# Patient Record
Sex: Female | Born: 1945 | Race: White | Hispanic: No | Marital: Single | Smoking: Never smoker
Health system: Southern US, Community
[De-identification: ages and names within clinical notes are randomized; demographics above are authoritative.]

## PROBLEM LIST (undated history)

## (undated) DIAGNOSIS — I499 Cardiac arrhythmia, unspecified: Secondary | ICD-10-CM

## (undated) DIAGNOSIS — T8859XA Other complications of anesthesia, initial encounter: Secondary | ICD-10-CM

## (undated) DIAGNOSIS — K469 Unspecified abdominal hernia without obstruction or gangrene: Secondary | ICD-10-CM

## (undated) DIAGNOSIS — F32A Depression, unspecified: Secondary | ICD-10-CM

## (undated) DIAGNOSIS — L659 Nonscarring hair loss, unspecified: Secondary | ICD-10-CM

## (undated) DIAGNOSIS — I471 Supraventricular tachycardia, unspecified: Secondary | ICD-10-CM

## (undated) DIAGNOSIS — F988 Other specified behavioral and emotional disorders with onset usually occurring in childhood and adolescence: Secondary | ICD-10-CM

## (undated) DIAGNOSIS — F329 Major depressive disorder, single episode, unspecified: Secondary | ICD-10-CM

## (undated) DIAGNOSIS — M1990 Unspecified osteoarthritis, unspecified site: Secondary | ICD-10-CM

## (undated) DIAGNOSIS — D649 Anemia, unspecified: Secondary | ICD-10-CM

## (undated) DIAGNOSIS — F909 Attention-deficit hyperactivity disorder, unspecified type: Secondary | ICD-10-CM

## (undated) DIAGNOSIS — K219 Gastro-esophageal reflux disease without esophagitis: Secondary | ICD-10-CM

## (undated) HISTORY — PX: ABDOMINAL HYSTERECTOMY: SHX81

## (undated) HISTORY — DX: Unspecified abdominal hernia without obstruction or gangrene: K46.9

## (undated) HISTORY — PX: BREAST ENHANCEMENT SURGERY: SHX7

## (undated) HISTORY — DX: Unspecified osteoarthritis, unspecified site: M19.90

## (undated) HISTORY — PX: BREAST IMPLANT REMOVAL: SUR1101

## (undated) HISTORY — DX: Attention-deficit hyperactivity disorder, unspecified type: F90.9

## (undated) HISTORY — DX: Depression, unspecified: F32.A

## (undated) HISTORY — DX: Nonscarring hair loss, unspecified: L65.9

---
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
## 1997-08-29 ENCOUNTER — Other Ambulatory Visit: Admission: RE | Admit: 1997-08-29 | Discharge: 1997-08-29 | Payer: Self-pay | Admitting: Obstetrics and Gynecology

## 1998-03-28 ENCOUNTER — Ambulatory Visit (HOSPITAL_COMMUNITY): Admission: RE | Admit: 1998-03-28 | Discharge: 1998-03-28 | Payer: Self-pay | Admitting: Family Medicine

## 1998-03-28 ENCOUNTER — Encounter: Payer: Self-pay | Admitting: Family Medicine

## 1998-05-22 ENCOUNTER — Encounter: Payer: Self-pay | Admitting: Family Medicine

## 1998-05-22 ENCOUNTER — Ambulatory Visit (HOSPITAL_COMMUNITY): Admission: RE | Admit: 1998-05-22 | Discharge: 1998-05-22 | Payer: Self-pay | Admitting: Family Medicine

## 1998-08-05 ENCOUNTER — Encounter: Payer: Self-pay | Admitting: Family Medicine

## 1998-08-05 ENCOUNTER — Ambulatory Visit (HOSPITAL_COMMUNITY): Admission: RE | Admit: 1998-08-05 | Discharge: 1998-08-05 | Payer: Self-pay | Admitting: Family Medicine

## 1998-09-19 ENCOUNTER — Other Ambulatory Visit: Admission: RE | Admit: 1998-09-19 | Discharge: 1998-09-19 | Payer: Self-pay | Admitting: Obstetrics and Gynecology

## 1998-10-06 ENCOUNTER — Encounter: Payer: Self-pay | Admitting: Family Medicine

## 1998-10-06 ENCOUNTER — Ambulatory Visit (HOSPITAL_COMMUNITY): Admission: RE | Admit: 1998-10-06 | Discharge: 1998-10-06 | Payer: Self-pay | Admitting: Family Medicine

## 1998-11-20 ENCOUNTER — Encounter: Payer: Self-pay | Admitting: Family Medicine

## 1998-11-20 ENCOUNTER — Ambulatory Visit (HOSPITAL_COMMUNITY): Admission: RE | Admit: 1998-11-20 | Discharge: 1998-11-20 | Payer: Self-pay | Admitting: Family Medicine

## 1999-01-19 ENCOUNTER — Encounter: Payer: Self-pay | Admitting: Family Medicine

## 1999-01-19 ENCOUNTER — Ambulatory Visit (HOSPITAL_COMMUNITY): Admission: RE | Admit: 1999-01-19 | Discharge: 1999-01-19 | Payer: Self-pay | Admitting: Family Medicine

## 1999-05-15 ENCOUNTER — Encounter: Payer: Self-pay | Admitting: Family Medicine

## 1999-05-15 ENCOUNTER — Ambulatory Visit (HOSPITAL_COMMUNITY): Admission: RE | Admit: 1999-05-15 | Discharge: 1999-05-15 | Payer: Self-pay | Admitting: Family Medicine

## 1999-10-01 ENCOUNTER — Ambulatory Visit (HOSPITAL_COMMUNITY): Admission: RE | Admit: 1999-10-01 | Discharge: 1999-10-01 | Payer: Self-pay | Admitting: Family Medicine

## 1999-10-01 ENCOUNTER — Encounter: Payer: Self-pay | Admitting: Family Medicine

## 1999-11-25 ENCOUNTER — Encounter: Payer: Self-pay | Admitting: Family Medicine

## 1999-11-25 ENCOUNTER — Ambulatory Visit (HOSPITAL_COMMUNITY): Admission: RE | Admit: 1999-11-25 | Discharge: 1999-11-25 | Payer: Self-pay | Admitting: Family Medicine

## 1999-12-07 ENCOUNTER — Other Ambulatory Visit: Admission: RE | Admit: 1999-12-07 | Discharge: 1999-12-07 | Payer: Self-pay | Admitting: Obstetrics and Gynecology

## 2000-01-31 ENCOUNTER — Ambulatory Visit (HOSPITAL_COMMUNITY): Admission: RE | Admit: 2000-01-31 | Discharge: 2000-01-31 | Payer: Self-pay | Admitting: Neurosurgery

## 2000-01-31 ENCOUNTER — Encounter: Payer: Self-pay | Admitting: Neurosurgery

## 2000-12-08 ENCOUNTER — Other Ambulatory Visit: Admission: RE | Admit: 2000-12-08 | Discharge: 2000-12-08 | Payer: Self-pay | Admitting: Obstetrics and Gynecology

## 2001-03-10 ENCOUNTER — Encounter: Payer: Self-pay | Admitting: Family Medicine

## 2001-03-10 ENCOUNTER — Encounter: Admission: RE | Admit: 2001-03-10 | Discharge: 2001-03-10 | Payer: Self-pay | Admitting: Family Medicine

## 2001-08-03 ENCOUNTER — Encounter: Payer: Self-pay | Admitting: Family Medicine

## 2001-08-03 ENCOUNTER — Encounter: Admission: RE | Admit: 2001-08-03 | Discharge: 2001-08-03 | Payer: Self-pay | Admitting: Family Medicine

## 2001-12-06 ENCOUNTER — Encounter: Payer: Self-pay | Admitting: Family Medicine

## 2001-12-06 ENCOUNTER — Encounter: Admission: RE | Admit: 2001-12-06 | Discharge: 2001-12-06 | Payer: Self-pay | Admitting: Family Medicine

## 2002-02-21 ENCOUNTER — Encounter: Admission: RE | Admit: 2002-02-21 | Discharge: 2002-02-21 | Payer: Self-pay | Admitting: Family Medicine

## 2002-02-21 ENCOUNTER — Encounter: Payer: Self-pay | Admitting: Family Medicine

## 2002-06-27 ENCOUNTER — Encounter: Payer: Self-pay | Admitting: Family Medicine

## 2002-06-27 ENCOUNTER — Encounter: Admission: RE | Admit: 2002-06-27 | Discharge: 2002-06-27 | Payer: Self-pay | Admitting: Family Medicine

## 2003-02-11 ENCOUNTER — Encounter: Admission: RE | Admit: 2003-02-11 | Discharge: 2003-02-11 | Payer: Self-pay | Admitting: Family Medicine

## 2004-09-28 ENCOUNTER — Encounter: Admission: RE | Admit: 2004-09-28 | Discharge: 2004-09-28 | Payer: Self-pay | Admitting: Family Medicine

## 2005-02-08 ENCOUNTER — Encounter: Admission: RE | Admit: 2005-02-08 | Discharge: 2005-02-08 | Payer: Self-pay | Admitting: Family Medicine

## 2005-05-07 ENCOUNTER — Encounter: Admission: RE | Admit: 2005-05-07 | Discharge: 2005-05-07 | Payer: Self-pay | Admitting: Family Medicine

## 2006-01-21 ENCOUNTER — Encounter: Admission: RE | Admit: 2006-01-21 | Discharge: 2006-01-21 | Payer: Self-pay | Admitting: Family Medicine

## 2006-02-08 ENCOUNTER — Encounter: Admission: RE | Admit: 2006-02-08 | Discharge: 2006-02-08 | Payer: Self-pay | Admitting: Family Medicine

## 2006-05-20 ENCOUNTER — Ambulatory Visit (HOSPITAL_COMMUNITY): Admission: RE | Admit: 2006-05-20 | Discharge: 2006-05-20 | Payer: Self-pay | Admitting: Family Medicine

## 2006-08-29 ENCOUNTER — Encounter: Admission: RE | Admit: 2006-08-29 | Discharge: 2006-08-29 | Payer: Self-pay | Admitting: Family Medicine

## 2007-02-27 ENCOUNTER — Encounter: Admission: RE | Admit: 2007-02-27 | Discharge: 2007-02-27 | Payer: Self-pay | Admitting: Family Medicine

## 2007-10-12 ENCOUNTER — Encounter: Admission: RE | Admit: 2007-10-12 | Discharge: 2007-10-12 | Payer: Self-pay | Admitting: Family Medicine

## 2008-01-23 ENCOUNTER — Emergency Department (HOSPITAL_COMMUNITY): Admission: EM | Admit: 2008-01-23 | Discharge: 2008-01-23 | Payer: Self-pay | Admitting: Emergency Medicine

## 2008-02-08 ENCOUNTER — Encounter: Admission: RE | Admit: 2008-02-08 | Discharge: 2008-02-08 | Payer: Self-pay | Admitting: Family Medicine

## 2008-02-22 ENCOUNTER — Encounter: Admission: RE | Admit: 2008-02-22 | Discharge: 2008-02-22 | Payer: Self-pay | Admitting: Family Medicine

## 2008-08-08 ENCOUNTER — Encounter: Admission: RE | Admit: 2008-08-08 | Discharge: 2008-08-08 | Payer: Self-pay | Admitting: Family Medicine

## 2008-08-29 ENCOUNTER — Encounter: Admission: RE | Admit: 2008-08-29 | Discharge: 2008-08-29 | Payer: Self-pay | Admitting: Family Medicine

## 2009-01-27 ENCOUNTER — Encounter: Admission: RE | Admit: 2009-01-27 | Discharge: 2009-01-27 | Payer: Self-pay | Admitting: Family Medicine

## 2009-06-01 ENCOUNTER — Emergency Department (HOSPITAL_COMMUNITY): Admission: EM | Admit: 2009-06-01 | Discharge: 2009-06-01 | Payer: Self-pay | Admitting: Family Medicine

## 2009-06-12 ENCOUNTER — Encounter: Admission: RE | Admit: 2009-06-12 | Discharge: 2009-06-12 | Payer: Self-pay | Admitting: Family Medicine

## 2009-07-04 ENCOUNTER — Encounter: Admission: RE | Admit: 2009-07-04 | Discharge: 2009-07-04 | Payer: Self-pay | Admitting: Family Medicine

## 2010-02-27 ENCOUNTER — Encounter
Admission: RE | Admit: 2010-02-27 | Discharge: 2010-02-27 | Payer: Self-pay | Source: Home / Self Care | Attending: Family Medicine | Admitting: Family Medicine

## 2010-03-29 ENCOUNTER — Encounter: Payer: Self-pay | Admitting: Family Medicine

## 2010-12-31 ENCOUNTER — Other Ambulatory Visit: Payer: Self-pay | Admitting: Chiropractic Medicine

## 2010-12-31 ENCOUNTER — Ambulatory Visit
Admission: RE | Admit: 2010-12-31 | Discharge: 2010-12-31 | Disposition: A | Discharge status: Home / Self Care | Payer: BC Managed Care – PPO | Source: Ambulatory Visit | Attending: Chiropractic Medicine | Admitting: Chiropractic Medicine

## 2010-12-31 DIAGNOSIS — R52 Pain, unspecified: Secondary | ICD-10-CM

## 2012-02-17 ENCOUNTER — Other Ambulatory Visit: Payer: Self-pay | Admitting: Family Medicine

## 2012-02-17 DIAGNOSIS — M549 Dorsalgia, unspecified: Secondary | ICD-10-CM

## 2012-02-24 ENCOUNTER — Ambulatory Visit
Admission: RE | Admit: 2012-02-24 | Discharge: 2012-02-24 | Disposition: A | Discharge status: Home / Self Care | Payer: BC Managed Care – PPO | Source: Ambulatory Visit | Attending: Family Medicine | Admitting: Family Medicine

## 2012-02-24 DIAGNOSIS — M549 Dorsalgia, unspecified: Secondary | ICD-10-CM

## 2012-02-24 MED ORDER — IOHEXOL 180 MG/ML  SOLN
1.0000 mL | Freq: Once | INTRAMUSCULAR | Status: AC | PRN
  Administered 2012-02-24: 1 mL via EPIDURAL

## 2012-02-24 MED ORDER — METHYLPREDNISOLONE ACETATE 40 MG/ML INJ SUSP (RADIOLOG
120.0000 mg | Freq: Once | INTRAMUSCULAR | Status: AC
  Administered 2012-02-24: 120 mg via EPIDURAL

## 2012-10-14 ENCOUNTER — Encounter: Payer: Self-pay | Admitting: Internal Medicine

## 2012-11-09 ENCOUNTER — Other Ambulatory Visit: Payer: Self-pay | Admitting: Family Medicine

## 2012-11-09 DIAGNOSIS — M549 Dorsalgia, unspecified: Secondary | ICD-10-CM

## 2012-11-15 ENCOUNTER — Ambulatory Visit
Admission: RE | Admit: 2012-11-15 | Discharge: 2012-11-15 | Disposition: A | Discharge status: Home / Self Care | Payer: Medicare Other | Source: Ambulatory Visit | Attending: Family Medicine | Admitting: Family Medicine

## 2012-11-15 VITALS — BP 114/66 | HR 64

## 2012-11-15 DIAGNOSIS — M549 Dorsalgia, unspecified: Secondary | ICD-10-CM

## 2012-11-15 MED ORDER — METHYLPREDNISOLONE ACETATE 40 MG/ML INJ SUSP (RADIOLOG
120.0000 mg | Freq: Once | INTRAMUSCULAR | Status: AC
  Administered 2012-11-15: 120 mg via EPIDURAL

## 2012-11-15 MED ORDER — IOHEXOL 180 MG/ML  SOLN
1.0000 mL | Freq: Once | INTRAMUSCULAR | Status: AC | PRN
  Administered 2012-11-15: 1 mL via EPIDURAL

## 2012-12-23 DIAGNOSIS — F331 Major depressive disorder, recurrent, moderate: Secondary | ICD-10-CM | POA: Diagnosis not present

## 2012-12-31 DIAGNOSIS — Z23 Encounter for immunization: Secondary | ICD-10-CM | POA: Diagnosis not present

## 2013-01-27 DIAGNOSIS — F331 Major depressive disorder, recurrent, moderate: Secondary | ICD-10-CM | POA: Diagnosis not present

## 2013-02-24 DIAGNOSIS — F331 Major depressive disorder, recurrent, moderate: Secondary | ICD-10-CM | POA: Diagnosis not present

## 2013-03-05 DIAGNOSIS — IMO0002 Reserved for concepts with insufficient information to code with codable children: Secondary | ICD-10-CM | POA: Diagnosis not present

## 2013-03-24 DIAGNOSIS — F331 Major depressive disorder, recurrent, moderate: Secondary | ICD-10-CM | POA: Diagnosis not present

## 2013-05-12 DIAGNOSIS — F331 Major depressive disorder, recurrent, moderate: Secondary | ICD-10-CM | POA: Diagnosis not present

## 2013-05-25 ENCOUNTER — Encounter: Payer: Self-pay | Admitting: Internal Medicine

## 2013-06-16 DIAGNOSIS — F331 Major depressive disorder, recurrent, moderate: Secondary | ICD-10-CM | POA: Diagnosis not present

## 2013-06-21 ENCOUNTER — Encounter: Payer: Self-pay | Admitting: Internal Medicine

## 2013-07-03 DIAGNOSIS — H52209 Unspecified astigmatism, unspecified eye: Secondary | ICD-10-CM | POA: Diagnosis not present

## 2013-07-03 DIAGNOSIS — H35369 Drusen (degenerative) of macula, unspecified eye: Secondary | ICD-10-CM | POA: Diagnosis not present

## 2013-07-21 DIAGNOSIS — F331 Major depressive disorder, recurrent, moderate: Secondary | ICD-10-CM | POA: Diagnosis not present

## 2013-08-08 ENCOUNTER — Ambulatory Visit (AMBULATORY_SURGERY_CENTER): Payer: Self-pay

## 2013-08-08 VITALS — Ht 61.0 in | Wt 146.2 lb

## 2013-08-08 DIAGNOSIS — Z1211 Encounter for screening for malignant neoplasm of colon: Secondary | ICD-10-CM

## 2013-08-08 MED ORDER — MOVIPREP 100 G PO SOLR: 1.0000 | Freq: Once | ORAL | Status: DC

## 2013-08-08 NOTE — Progress Notes (Signed)
No allergies to eggs or soy No home oxygen No diet/weight loss meds Past issue with itching after general anesthesia Has email  Emmi instructions given for colonoscopy

## 2013-08-16 ENCOUNTER — Telehealth: Payer: Self-pay | Admitting: Internal Medicine

## 2013-08-16 NOTE — Telephone Encounter (Signed)
Miralax prep explained over phone.

## 2013-08-22 ENCOUNTER — Telehealth: Payer: Self-pay | Admitting: Internal Medicine

## 2013-08-22 ENCOUNTER — Ambulatory Visit (AMBULATORY_SURGERY_CENTER): Payer: Medicare Other | Admitting: Internal Medicine

## 2013-08-22 ENCOUNTER — Encounter: Payer: Self-pay | Admitting: Internal Medicine

## 2013-08-22 VITALS — BP 127/60 | HR 63 | Temp 98.4°F | Resp 23 | Ht 61.0 in | Wt 146.0 lb

## 2013-08-22 DIAGNOSIS — E669 Obesity, unspecified: Secondary | ICD-10-CM | POA: Diagnosis not present

## 2013-08-22 DIAGNOSIS — Z1211 Encounter for screening for malignant neoplasm of colon: Secondary | ICD-10-CM

## 2013-08-22 DIAGNOSIS — F909 Attention-deficit hyperactivity disorder, unspecified type: Secondary | ICD-10-CM | POA: Diagnosis not present

## 2013-08-22 MED ORDER — SODIUM CHLORIDE 0.9 % IV SOLN: 500.0000 mL | INTRAVENOUS | Status: DC

## 2013-08-22 NOTE — Telephone Encounter (Signed)
Spoke with pt. States she is very uncomfortable.  Went home had warm liquid and took 1/2 xanax. Went to sleep for for a while.  Woke up with same discomfort which she rates as 8.5 on pain scale if she tries to walk.  States she feels better Lying down.Drank ginger ale and belched a few time.  Advised to not drink again until I speak with Dr. Olevia Perches.  Will advise Dr. Olevia Perches.  [place] Dr. Olevia Perches advised of the above symptoms.  She phoned pt. And advised OTC medications for relief.

## 2013-08-22 NOTE — Patient Instructions (Signed)
YOU HAD AN ENDOSCOPIC PROCEDURE TODAY AT THE [facility]: Refer to the procedure report that was given to you for any specific questions about what was found during the examination.  If the procedure report does not answer your questions, please call your gastroenterologist to clarify.  If you requested that your care partner not be given the details of your procedure findings, then the procedure report has been included in a sealed envelope for you to review at your convenience later.  YOU SHOULD EXPECT: Some feelings of bloating in the abdomen. Passage of more gas than usual.  Walking can help get rid of the air that was put into your GI tract during the procedure and reduce the bloating. If you had a lower endoscopy (such as a colonoscopy or flexible sigmoidoscopy) you may notice spotting of blood in your stool or on the toilet paper. If you underwent a bowel prep for your procedure, then you may not have a normal bowel movement for a few days.  DIET: Your first meal following the procedure should be a light meal and then it is ok to progress to your normal diet.  A half-sandwich or bowl of soup is an example of a good first meal.  Heavy or fried foods are harder to digest and may make you feel nauseous or bloated.  Likewise meals heavy in dairy and vegetables can cause extra gas to form and this can also increase the bloating.  Drink plenty of fluids but you should avoid alcoholic beverages for 24 hours.  ACTIVITY: Your care partner should take you home directly after the procedure.  You should plan to take it easy, moving slowly for the rest of the day.  You can resume normal activity the day after the procedure however you should NOT DRIVE or use heavy machinery for 24 hours (because of the sedation medicines used during the test).    SYMPTOMS TO REPORT IMMEDIATELY: A gastroenterologist can be reached at any hour.  During normal business hours, 8:30 AM to 5:00 PM Monday through Friday,  call [phone].  After hours and on weekends, please call the GI answering service at [phone] who will take a message and have the physician on call contact you.   Following lower endoscopy (colonoscopy or flexible sigmoidoscopy):  Excessive amounts of blood in the stool  Significant tenderness or worsening of abdominal pains  Swelling of the abdomen that is new, acute  Fever of 100F or higher    FOLLOW UP: If any biopsies were taken you will be contacted by phone or by letter within the next 1-3 weeks.  Call your gastroenterologist if you have not heard about the biopsies in 3 weeks.  Our staff will call the home number listed on your records the next business day following your procedure to check on you and address any questions or concerns that you may have at that time regarding the information given to you following your procedure. This is a courtesy call and so if there is no answer at the home number and we have not heard from you through the emergency physician on call, we will assume that you have returned to your regular daily activities without incident.  SIGNATURES/CONFIDENTIALITY: You and/or your care partner have signed paperwork which will be entered into your electronic medical record.  These signatures attest to the fact that that the information above on your After Visit Summary has been reviewed and is understood.  Full responsibility of the confidentiality   of this discharge information lies with you and/or your care-partner.  Diverticulosis and high fiber diet. Recall 10 years-2025

## 2013-08-22 NOTE — Progress Notes (Signed)
Pt. Left with some upper abdominal distention.  Rectal tube and levsin given. Pt. Up in knee chest positon with small amount of air passed. Dr. Olevia Perches saw pt. Prior to discharge. Encouraged to try warm liquids at home. Pt. Voiced understanding.

## 2013-08-22 NOTE — Progress Notes (Signed)
Report to PACU, RN, vss, BBS= Clear.  

## 2013-08-22 NOTE — Op Note (Signed)
[facility]  [address] [place], [id number]   COLONOSCOPY PROCEDURE REPORT  PATIENT: Kathleen Daniels, Kathleen Daniels  MR#: [id number] BIRTHDATE: Jan 11, 1946 , 79  yrs. old GENDER: Female ENDOSCOPIST: Lafayette Dragon, MD REFERRED BP:ZWCHEN Inda Merlin, M.D. PROCEDURE DATE:  08/22/2013 PROCEDURE:   Colonoscopy, screening First Screening Colonoscopy - Avg.  risk and is 50 yrs.  old or older - No.  Prior Negative Screening - Now for repeat screening. 10 or more years since last screening  History of Adenoma - Now for follow-up colonoscopy & has been > or = to 3 yrs.  N/A  Polyps Removed Today? No.  Recommend repeat exam, <10 yrs? No. ASA CLASS:   Class II INDICATIONS:Average risk patient for colon cancer and last exam in October 2004 was normal. MEDICATIONS: MAC sedation, administered by CRNA and propofol (Diprivan) 300mg  IV  DESCRIPTION OF PROCEDURE:   After the risks benefits and alternatives of the procedure were thoroughly explained, informed consent was obtained.  A digital rectal exam revealed no abnormalities of the rectum.   The LB [id number]  endoscope was introduced through the anus and advanced to the cecum, which was identified by both the appendix and ileocecal valve. No adverse events experienced.   The quality of the prep was good, using MoviPrep  The instrument was then slowly withdrawn as the colon was fully examined.      COLON FINDINGS: There was mild diverticulosis noted in the sigmoid colon with associated angulation, tortuosity and muscular hypertrophy.  Retroflexed views revealed no abnormalities. The time to cecum=6 minutes 36 seconds.  Withdrawal time=6 minutes 59 seconds.  The scope was withdrawn and the procedure completed. COMPLICATIONS: There were no complications.  ENDOSCOPIC IMPRESSION: There was mild diverticulosis noted in the sigmoid colon  RECOMMENDATIONS: high fiber diet Recall colonoscopy in 10 years Fiber supplements such as  Metamucil or FiberCon   eSigned:  Lafayette Dragon, MD 08/22/2013 10:11 AM   cc:   PATIENT NAME:  Kathleen Daniels, Kathleen Daniels MR#: [id number]

## 2013-08-23 ENCOUNTER — Telehealth: Payer: Self-pay | Admitting: Internal Medicine

## 2013-08-23 ENCOUNTER — Telehealth: Payer: Self-pay | Admitting: *Deleted

## 2013-08-23 ENCOUNTER — Encounter: Payer: Self-pay | Admitting: *Deleted

## 2013-08-23 NOTE — Telephone Encounter (Signed)
Spoke with pt on call back this morning.  Please see documentation in that telephone note.

## 2013-08-23 NOTE — Telephone Encounter (Signed)
  Follow up Call-  Call back number 08/22/2013  Post procedure Call Back phone  # [phone]  Permission to leave phone message Yes     Patient questions:  Do you have a fever, pain , or abdominal swelling? no Pain Score  0 *  Have you tolerated food without any problems? no  Have you been able to return to your normal activities? no  Do you have any questions about your discharge instructions: Diet   yes Medications  no Follow up visit  no  Do you have questions or concerns about your Care? yes  Actions: * If pain score is 4 or above: No action needed, pain <4.  Pt called before her call back was done.  She states that she continues to have diarrhea and feels weak.  She has passed large amounts of air and states that the pain she is having in her abdomen is gas related.  I gave her suggestions as to what to eat and encouraged her to drink plenty of fluids.  She doesn't feel that she can go to work today so I will send her an excuse note for work today in [employer].  I also told her to call back if needed and that I will call back to check on her later.

## 2013-08-23 NOTE — Telephone Encounter (Signed)
Called back to check on patient and she states that she is feeling better.  The diarrhea has stopped and she has been able to eat.  She is continuing to pass air and I encouraged her to do so.  She will call back with any further problems.

## 2013-09-01 DIAGNOSIS — F331 Major depressive disorder, recurrent, moderate: Secondary | ICD-10-CM | POA: Diagnosis not present

## 2013-10-03 DIAGNOSIS — Z1231 Encounter for screening mammogram for malignant neoplasm of breast: Secondary | ICD-10-CM | POA: Diagnosis not present

## 2013-11-10 DIAGNOSIS — F331 Major depressive disorder, recurrent, moderate: Secondary | ICD-10-CM | POA: Diagnosis not present

## 2013-12-06 DIAGNOSIS — Z0000 Encounter for general adult medical examination without abnormal findings: Secondary | ICD-10-CM | POA: Diagnosis not present

## 2013-12-06 DIAGNOSIS — Z13220 Encounter for screening for lipoid disorders: Secondary | ICD-10-CM | POA: Diagnosis not present

## 2013-12-06 DIAGNOSIS — J301 Allergic rhinitis due to pollen: Secondary | ICD-10-CM | POA: Diagnosis not present

## 2013-12-06 DIAGNOSIS — Z7189 Other specified counseling: Secondary | ICD-10-CM | POA: Diagnosis not present

## 2013-12-06 DIAGNOSIS — Z131 Encounter for screening for diabetes mellitus: Secondary | ICD-10-CM | POA: Diagnosis not present

## 2013-12-06 DIAGNOSIS — E780 Pure hypercholesterolemia: Secondary | ICD-10-CM | POA: Diagnosis not present

## 2013-12-06 DIAGNOSIS — Z23 Encounter for immunization: Secondary | ICD-10-CM | POA: Diagnosis not present

## 2013-12-06 DIAGNOSIS — R221 Localized swelling, mass and lump, neck: Secondary | ICD-10-CM | POA: Diagnosis not present

## 2013-12-06 DIAGNOSIS — F329 Major depressive disorder, single episode, unspecified: Secondary | ICD-10-CM | POA: Diagnosis not present

## 2013-12-07 ENCOUNTER — Other Ambulatory Visit: Payer: Self-pay | Admitting: Family Medicine

## 2013-12-07 DIAGNOSIS — R221 Localized swelling, mass and lump, neck: Secondary | ICD-10-CM

## 2013-12-17 ENCOUNTER — Inpatient Hospital Stay: Admission: RE | Admit: 2013-12-17 | Payer: Medicare Other | Source: Ambulatory Visit

## 2013-12-17 ENCOUNTER — Other Ambulatory Visit: Payer: Self-pay | Admitting: Family Medicine

## 2013-12-17 ENCOUNTER — Ambulatory Visit
Admission: RE | Admit: 2013-12-17 | Discharge: 2013-12-17 | Disposition: A | Discharge status: Home / Self Care | Payer: Medicare Other | Source: Ambulatory Visit | Attending: Family Medicine | Admitting: Family Medicine

## 2013-12-17 DIAGNOSIS — R221 Localized swelling, mass and lump, neck: Secondary | ICD-10-CM | POA: Diagnosis not present

## 2013-12-17 DIAGNOSIS — Z79899 Other long term (current) drug therapy: Secondary | ICD-10-CM | POA: Diagnosis not present

## 2013-12-17 MED ORDER — IOHEXOL 300 MG/ML  SOLN
75.0000 mL | Freq: Once | INTRAMUSCULAR | Status: AC | PRN
  Administered 2013-12-17: 75 mL via INTRAVENOUS

## 2013-12-19 DIAGNOSIS — F331 Major depressive disorder, recurrent, moderate: Secondary | ICD-10-CM | POA: Diagnosis not present

## 2014-01-12 DIAGNOSIS — F331 Major depressive disorder, recurrent, moderate: Secondary | ICD-10-CM | POA: Diagnosis not present

## 2014-02-09 DIAGNOSIS — F331 Major depressive disorder, recurrent, moderate: Secondary | ICD-10-CM | POA: Diagnosis not present

## 2014-03-12 DIAGNOSIS — L821 Other seborrheic keratosis: Secondary | ICD-10-CM | POA: Diagnosis not present

## 2014-03-12 DIAGNOSIS — L309 Dermatitis, unspecified: Secondary | ICD-10-CM | POA: Diagnosis not present

## 2014-03-12 DIAGNOSIS — L298 Other pruritus: Secondary | ICD-10-CM | POA: Diagnosis not present

## 2014-03-16 DIAGNOSIS — F331 Major depressive disorder, recurrent, moderate: Secondary | ICD-10-CM | POA: Diagnosis not present

## 2014-05-11 DIAGNOSIS — F331 Major depressive disorder, recurrent, moderate: Secondary | ICD-10-CM | POA: Diagnosis not present

## 2014-05-16 ENCOUNTER — Ambulatory Visit (INDEPENDENT_AMBULATORY_CARE_PROVIDER_SITE_OTHER): Payer: Medicare Other | Admitting: Sports Medicine

## 2014-05-16 ENCOUNTER — Encounter: Payer: Self-pay | Admitting: Sports Medicine

## 2014-05-16 VITALS — BP 140/69 | Ht 61.0 in | Wt 135.0 lb

## 2014-05-16 DIAGNOSIS — M25521 Pain in right elbow: Secondary | ICD-10-CM

## 2014-05-16 DIAGNOSIS — S46111A Strain of muscle, fascia and tendon of long head of biceps, right arm, initial encounter: Secondary | ICD-10-CM | POA: Diagnosis not present

## 2014-05-16 DIAGNOSIS — S46211A Strain of muscle, fascia and tendon of other parts of biceps, right arm, initial encounter: Secondary | ICD-10-CM

## 2014-05-17 ENCOUNTER — Encounter: Payer: Self-pay | Admitting: Sports Medicine

## 2014-05-17 DIAGNOSIS — F329 Major depressive disorder, single episode, unspecified: Secondary | ICD-10-CM | POA: Insufficient documentation

## 2014-05-17 DIAGNOSIS — F32A Depression, unspecified: Secondary | ICD-10-CM | POA: Insufficient documentation

## 2014-05-17 DIAGNOSIS — F988 Other specified behavioral and emotional disorders with onset usually occurring in childhood and adolescence: Secondary | ICD-10-CM | POA: Insufficient documentation

## 2014-05-17 DIAGNOSIS — M25521 Pain in right elbow: Secondary | ICD-10-CM | POA: Insufficient documentation

## 2014-05-17 DIAGNOSIS — S46211A Strain of muscle, fascia and tendon of other parts of biceps, right arm, initial encounter: Secondary | ICD-10-CM | POA: Insufficient documentation

## 2014-05-17 NOTE — Progress Notes (Signed)
Kathleen Daniels - 69 y.o. female MRN [id number]  Date of birth: Feb 22, 1946  SUBJECTIVE:  Including CC & ROS.  Patient is a 69 yo female present today following an injury to her right upper extremity.  She reports 05/13/14 she was holding a bag recycling when the bag broke and quickly and forcefully flex her elbow and IR her shoulder and flex a pop and intense pull from her medial elbow into her mid anterior/medial upper arm. Since then she reports pain as a soreness that is worse with activities such as closing a door, dressing, hold bag. She has developed bruising and swelling. She denies any weakness, no decrease in elbow or shoulder ROM, no numbness or tingling in the arm or hand. She has been treating with ice but no NSAIDs. She reports a previous injury to this elbow in Nov 2014 when she fell on her right elbow and develop elbow pain and bruising over the posterior triceps, as well as intermittent numbness and decrease elbow ROM. Patient was being seen for this problem at [facility] where that obtained an MRI (details below). After MRI patients recommendations from Ortho was that most of her pain was from arthritis and not unstable and gave her options of continue conservative management vs. Operative arthroscopy surgical debridement. Patient did not precede with surgery. Since this was workman comp related patient was then released back to work full duty with 5% partial impairment rating.   Right Hand dominant ROS: Review of systems otherwise negative except for information present in HPI  HISTORY: Past Medical, Surgical, Social, and Family History Reviewed & Updated per EMR. Pertinent Historical Findings include: Anxiety and depression ADD  DATA REVIEWED: MRI 08/09/13: severe cystic arthritis and chrondal loss, and loose body/ joint effusion. 70mm full thickness tear of proximal RCL and proximal partial tear of the UCL. Moderate biceps tendonitis, and ulnar nerve neuritis.   PHYSICAL EXAM:    VS: BP:140/69 mmHg  HR: bpm  TEMP: ( )  RESP:   HT:5\' 1"  (154.9 cm)   WT:135 lb (61.236 kg)  BMI:25.6 ELBOW EXAM: General: well nourished Skin of UE: warm; dry, no rashes, lesions, ecchymosis or erythema. Vascular: radial pulses 2+ bilaterally Neurologically: Sensation to light touch upper extremities equal and intact bilaterally.     Observation: no elbow joint edema, moderate swelling and eccymosis over the medial elbow and extending up into the mid substance of the biceps Short (medial) head.  Palpation:  TTP over the distal short head and mid substance of the bicep brachii. There is a palpable muscular nodule within the mid substance.  no tenderness to deep over the olecranon process, radial head, medial epicondyle or lateral epicondyle    ROM: normal supination and pronation, elbow extension, full flexion    Strength: Normal 5/5 strength with extension/ flexion, pronation and supination.           Special test: Hook test indicates distal bicep is present stable medial and lateral collateral ligaments, no tenderness with finger extension   MSK [place]: There is hypoechoic changes in the mid substance of the short head of the biceps proximally 1-2 cm in length with the muscle tissue is split and damaged with edema. Distal bicep appears intact.   ASSESSMENT & PLAN: See problem based charting & AVS for pt instructions. Impression: Traumatic partial tear of the right short head of the biceps tendon  Recommendations: - RICE, icing 1-2 times daily, provided a compression body helix sleeve -Referred for elbow MRI to further  classify the degree of tear and confirm distal biceps insertion is not ruptured - NSAIDs PRN  -Avoid any upper Ext lifting  -Will f/u with MRI results over the phone. Depending on results if no rupture will refer to PT if ruptured will refer to orthopedic surgery.

## 2014-05-20 ENCOUNTER — Encounter: Payer: Self-pay | Admitting: Sports Medicine

## 2014-05-26 ENCOUNTER — Ambulatory Visit
Admission: RE | Admit: 2014-05-26 | Discharge: 2014-05-26 | Disposition: A | Discharge status: Home / Self Care | Payer: Medicare Other | Source: Ambulatory Visit | Attending: Sports Medicine | Admitting: Sports Medicine

## 2014-05-26 DIAGNOSIS — M25521 Pain in right elbow: Secondary | ICD-10-CM

## 2014-05-26 DIAGNOSIS — S56511A Strain of other extensor muscle, fascia and tendon at forearm level, right arm, initial encounter: Secondary | ICD-10-CM | POA: Diagnosis not present

## 2014-05-26 DIAGNOSIS — S56211A Strain of other flexor muscle, fascia and tendon at forearm level, right arm, initial encounter: Secondary | ICD-10-CM | POA: Diagnosis not present

## 2014-05-30 ENCOUNTER — Telehealth: Payer: Self-pay | Admitting: Sports Medicine

## 2014-05-30 DIAGNOSIS — S46211A Strain of muscle, fascia and tendon of other parts of biceps, right arm, initial encounter: Secondary | ICD-10-CM

## 2014-05-30 MED ORDER — MELOXICAM 15 MG PO TABS: 15.0000 mg | ORAL_TABLET | Freq: Every day | ORAL | Status: DC

## 2014-05-30 NOTE — Telephone Encounter (Signed)
Spoke with patient over the phone concerning MRI results. MRI of the elbow revealed degenerative distal biceps tendinitis withdistinct tear. Small intrasubstance degenerative tears of the common flexor tendon at the medial epicondyle as well as the flexor tendon at the lateral epicondyle. 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 And ganglion cyst protruding into the cubital tunnel.  Reassured patient that the MRI did not extend high enough into her biceps muscle to clarify the extent of her mid substance biceps tear however it did confirm that there is no distal biceps rupture.  Continue treatment plan from this point is start patient on anti-inflammatory Mobic for the next 2 weeks. Patient discontinued the elbow sleeve provided because of skin irritation. She describes the most of her swelling has resolved but the bruising has progressed down into the forearm likely related to gravity. Plan follow-up on Tuesday 3/29 to reassess and we ultrasound

## 2014-06-01 DIAGNOSIS — F331 Major depressive disorder, recurrent, moderate: Secondary | ICD-10-CM | POA: Diagnosis not present

## 2014-06-04 ENCOUNTER — Encounter: Payer: Self-pay | Admitting: Sports Medicine

## 2014-06-04 ENCOUNTER — Ambulatory Visit (INDEPENDENT_AMBULATORY_CARE_PROVIDER_SITE_OTHER): Payer: Medicare Other | Admitting: Sports Medicine

## 2014-06-04 VITALS — BP 137/63 | HR 76 | Ht 61.0 in | Wt 135.0 lb

## 2014-06-04 DIAGNOSIS — S46111A Strain of muscle, fascia and tendon of long head of biceps, right arm, initial encounter: Secondary | ICD-10-CM | POA: Diagnosis not present

## 2014-06-04 DIAGNOSIS — S46211A Strain of muscle, fascia and tendon of other parts of biceps, right arm, initial encounter: Secondary | ICD-10-CM

## 2014-06-04 DIAGNOSIS — M25521 Pain in right elbow: Secondary | ICD-10-CM | POA: Diagnosis not present

## 2014-06-05 NOTE — Progress Notes (Signed)
Kathleen Daniels - 69 y.o. female MRN [id number]  Date of birth: 1945-09-12  SUBJECTIVE:  Including CC & ROS.  Patient is a 69 yo female present today following an injury to her right upper extremity.  Patient was seen 2 weeks ago for an injury occuring on 05/13/14 went she quickly/ forcefully flexed her right arm to catch a bag and felt a pop in her upper right arm in the biceps.  At her last visit US exam revealed a partial tear to the mid substance of the biceps and edema in the medial elbow joint.  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  She was sent for an MRI to confirm that the distal biceps was intact.  MRI revealed intact distal biceps, with degenerative tendonitis to the distal biceps, and small partial tear in the extensor and flexor common tendons there they attach to the lateral and medial epicondyle.  Today reports the almost all the swelling has resolved, the bruising has resolved, but she still has soreness in the biceps, medial elbow and with some positions. She no longer feels week and has full motion. She stopped the compression sleeve because of discomfort and did not take the Mobic and just continued the Aleve she always takes.   Right Hand dominant ROS: Review of systems otherwise negative except for information present in HPI  HISTORY: Past Medical, Surgical, Social, and Family History Reviewed & Updated per EMR. Pertinent Historical Findings include: Anxiety and depression ADD History of degenerative changes to the elbow in the past  DATA REVIEWED: MRI Right elbow 05/14/14: degenerative distal biceps tendon without tear, partial tear to the extensor common tendon at lateral epicondyle, small intrasubstance tear of the common flexor tendon at the medial epicondyle, severe OA of elbow joint, ganglion cyst protruding into he cubital tunnel  adjacent to the ulnar nerve  MRI Right elbow 08/09/13: severe cystic arthritis and chrondal loss, and loose body/ joint effusion. 44mm full thickness tear of proximal RCL and proximal partial tear of the UCL. Moderate biceps tendonitis, and ulnar nerve neuritis.   PHYSICAL EXAM:  VS: BP:137/63 mmHg  HR:76bpm  TEMP: ( )  RESP:   HT:5\' 1"  (154.9 cm)   WT:135 lb (61.236 kg)  BMI:25.6 ELBOW EXAM: General: well nourished Skin of UE: warm; dry, no rashes, lesions, ecchymosis or erythema. Vascular: radial pulses 2+ bilaterally Neurologically: Sensation to light touch upper extremities equal and intact bilaterally.     Observation: no elbow joint edema, mild swelling, no ecchymosis over the medial elbow Palpation: mild TTP over the distal and mid substance of the bicep brachii. There is a palpable muscular nodule within the mid substance in the distal upper arm no tenderness to deep over the olecranon process, radial head, medial epicondyle or lateral epicondyle    ROM: normal supination and pronation, elbow extension, full flexion    Strength: Normal 5/5 strength with extension/ flexion, pronation and supination.           Special test: Hook test indicates distal bicep is present stable medial and lateral collateral ligaments, no tenderness with finger extension   MSK [place]: There is hypoechoic changes in the mid substance of the short head of the biceps proximally 1-2 cm in length with the muscle tissue is split and damaged with edema. Distal bicep appears intact.   ASSESSMENT & PLAN: See problem based charting & AVS  for pt instructions. Impression: Traumatic partial tear of the right short head of the biceps tendon  Recommendations: - Continue as needed RICE, icing 1-2 times daily, use compression body helix sleeve as needed for comfort - NSAIDs PRN  -Referral to PT for starting eccentric strengthening and ROM exercises -F/u PRN

## 2014-06-22 DIAGNOSIS — F331 Major depressive disorder, recurrent, moderate: Secondary | ICD-10-CM | POA: Diagnosis not present

## 2014-07-31 DIAGNOSIS — H35361 Drusen (degenerative) of macula, right eye: Secondary | ICD-10-CM | POA: Diagnosis not present

## 2014-07-31 DIAGNOSIS — H5203 Hypermetropia, bilateral: Secondary | ICD-10-CM | POA: Diagnosis not present

## 2014-08-10 DIAGNOSIS — F331 Major depressive disorder, recurrent, moderate: Secondary | ICD-10-CM | POA: Diagnosis not present

## 2014-08-12 DIAGNOSIS — Z0100 Encounter for examination of eyes and vision without abnormal findings: Secondary | ICD-10-CM | POA: Diagnosis not present

## 2014-09-02 ENCOUNTER — Other Ambulatory Visit: Payer: Self-pay

## 2014-10-31 DIAGNOSIS — Z1231 Encounter for screening mammogram for malignant neoplasm of breast: Secondary | ICD-10-CM | POA: Diagnosis not present

## 2014-10-31 DIAGNOSIS — M899 Disorder of bone, unspecified: Secondary | ICD-10-CM | POA: Diagnosis not present

## 2014-11-02 DIAGNOSIS — F331 Major depressive disorder, recurrent, moderate: Secondary | ICD-10-CM | POA: Diagnosis not present

## 2014-12-25 DIAGNOSIS — Z0000 Encounter for general adult medical examination without abnormal findings: Secondary | ICD-10-CM | POA: Diagnosis not present

## 2014-12-25 DIAGNOSIS — L659 Nonscarring hair loss, unspecified: Secondary | ICD-10-CM | POA: Diagnosis not present

## 2014-12-25 DIAGNOSIS — Z131 Encounter for screening for diabetes mellitus: Secondary | ICD-10-CM | POA: Diagnosis not present

## 2014-12-25 DIAGNOSIS — Z23 Encounter for immunization: Secondary | ICD-10-CM | POA: Diagnosis not present

## 2014-12-25 DIAGNOSIS — Z136 Encounter for screening for cardiovascular disorders: Secondary | ICD-10-CM | POA: Diagnosis not present

## 2014-12-25 DIAGNOSIS — F325 Major depressive disorder, single episode, in full remission: Secondary | ICD-10-CM | POA: Diagnosis not present

## 2014-12-28 DIAGNOSIS — F331 Major depressive disorder, recurrent, moderate: Secondary | ICD-10-CM | POA: Diagnosis not present

## 2015-02-08 DIAGNOSIS — F331 Major depressive disorder, recurrent, moderate: Secondary | ICD-10-CM | POA: Diagnosis not present

## 2015-03-04 DIAGNOSIS — R509 Fever, unspecified: Secondary | ICD-10-CM | POA: Diagnosis not present

## 2015-03-04 DIAGNOSIS — J329 Chronic sinusitis, unspecified: Secondary | ICD-10-CM | POA: Diagnosis not present

## 2015-03-04 DIAGNOSIS — J029 Acute pharyngitis, unspecified: Secondary | ICD-10-CM | POA: Diagnosis not present

## 2015-03-29 DIAGNOSIS — F331 Major depressive disorder, recurrent, moderate: Secondary | ICD-10-CM | POA: Diagnosis not present

## 2015-04-23 DIAGNOSIS — B0089 Other herpesviral infection: Secondary | ICD-10-CM | POA: Diagnosis not present

## 2015-04-23 DIAGNOSIS — I788 Other diseases of capillaries: Secondary | ICD-10-CM | POA: Diagnosis not present

## 2015-04-23 DIAGNOSIS — L821 Other seborrheic keratosis: Secondary | ICD-10-CM | POA: Diagnosis not present

## 2015-04-23 DIAGNOSIS — D2261 Melanocytic nevi of right upper limb, including shoulder: Secondary | ICD-10-CM | POA: Diagnosis not present

## 2015-05-10 DIAGNOSIS — F331 Major depressive disorder, recurrent, moderate: Secondary | ICD-10-CM | POA: Diagnosis not present

## 2015-06-21 DIAGNOSIS — F331 Major depressive disorder, recurrent, moderate: Secondary | ICD-10-CM | POA: Diagnosis not present

## 2015-07-26 DIAGNOSIS — F331 Major depressive disorder, recurrent, moderate: Secondary | ICD-10-CM | POA: Diagnosis not present

## 2015-08-13 DIAGNOSIS — H5203 Hypermetropia, bilateral: Secondary | ICD-10-CM | POA: Diagnosis not present

## 2015-08-13 DIAGNOSIS — H35361 Drusen (degenerative) of macula, right eye: Secondary | ICD-10-CM | POA: Diagnosis not present

## 2015-08-23 DIAGNOSIS — F331 Major depressive disorder, recurrent, moderate: Secondary | ICD-10-CM | POA: Diagnosis not present

## 2015-09-27 DIAGNOSIS — F331 Major depressive disorder, recurrent, moderate: Secondary | ICD-10-CM | POA: Diagnosis not present

## 2015-10-31 DIAGNOSIS — L0109 Other impetigo: Secondary | ICD-10-CM | POA: Diagnosis not present

## 2015-10-31 DIAGNOSIS — L738 Other specified follicular disorders: Secondary | ICD-10-CM | POA: Diagnosis not present

## 2015-11-01 DIAGNOSIS — F331 Major depressive disorder, recurrent, moderate: Secondary | ICD-10-CM | POA: Diagnosis not present

## 2015-11-14 DIAGNOSIS — Z1231 Encounter for screening mammogram for malignant neoplasm of breast: Secondary | ICD-10-CM | POA: Diagnosis not present

## 2015-11-29 DIAGNOSIS — F331 Major depressive disorder, recurrent, moderate: Secondary | ICD-10-CM | POA: Diagnosis not present

## 2015-12-13 DIAGNOSIS — F331 Major depressive disorder, recurrent, moderate: Secondary | ICD-10-CM | POA: Diagnosis not present

## 2015-12-31 DIAGNOSIS — F325 Major depressive disorder, single episode, in full remission: Secondary | ICD-10-CM | POA: Diagnosis not present

## 2015-12-31 DIAGNOSIS — Z23 Encounter for immunization: Secondary | ICD-10-CM | POA: Diagnosis not present

## 2015-12-31 DIAGNOSIS — Z131 Encounter for screening for diabetes mellitus: Secondary | ICD-10-CM | POA: Diagnosis not present

## 2015-12-31 DIAGNOSIS — Z0000 Encounter for general adult medical examination without abnormal findings: Secondary | ICD-10-CM | POA: Diagnosis not present

## 2015-12-31 DIAGNOSIS — L659 Nonscarring hair loss, unspecified: Secondary | ICD-10-CM | POA: Diagnosis not present

## 2016-01-24 DIAGNOSIS — F331 Major depressive disorder, recurrent, moderate: Secondary | ICD-10-CM | POA: Diagnosis not present

## 2016-02-14 DIAGNOSIS — F331 Major depressive disorder, recurrent, moderate: Secondary | ICD-10-CM | POA: Diagnosis not present

## 2016-03-27 DIAGNOSIS — F331 Major depressive disorder, recurrent, moderate: Secondary | ICD-10-CM | POA: Diagnosis not present

## 2016-04-27 IMAGING — MR MR ELBOW*R* W/O CM
4 of 6 series · 23 of 40 positions shown · non-contrast
Comparison: None.

CLINICAL DATA: Biceps tendon tear. Right elbow pain and bruising.
Lifting injury on 05/14/2014.

EXAM:
MRI OF THE RIGHT ELBOW WITHOUT CONTRAST
TECHNIQUE: Multiplanar, multisequence MR imaging of the elbow was performed. No
intravenous contrast was administered.

[Series 8: T2 fat-sat · axial · 4.5mm · 0.59mm/px · z∈[-112,-5]mm · 7 of 19 slices shown (1 of 2)]
[im 1/19]
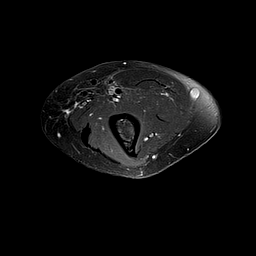
[im 4/19]
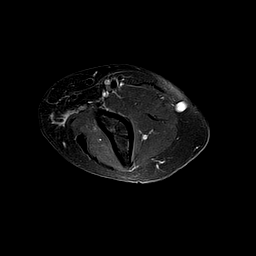
[im 7/19]
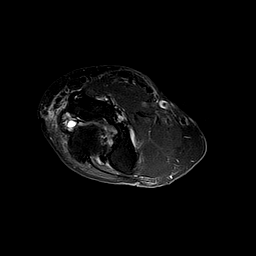
[im 10/19]
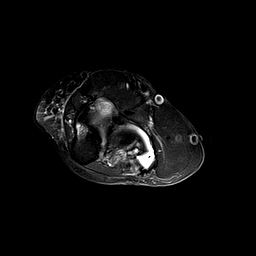
[im 13/19]
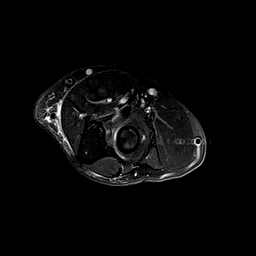
[im 16/19]
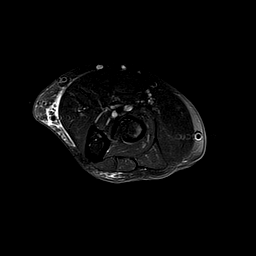
[im 19/19]
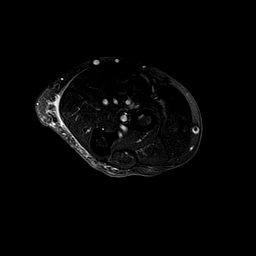

[Series 12: PD fat-sat · oblique · 5.0mm · 0.39mm/px · 5 of 11 slices shown (1 of 2)]
[im 1/11]
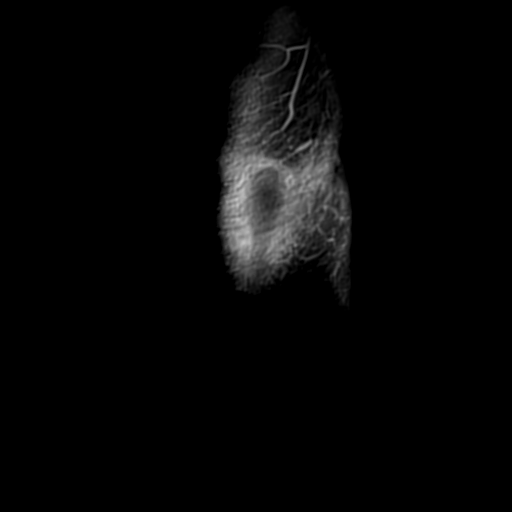
[im 3/11]
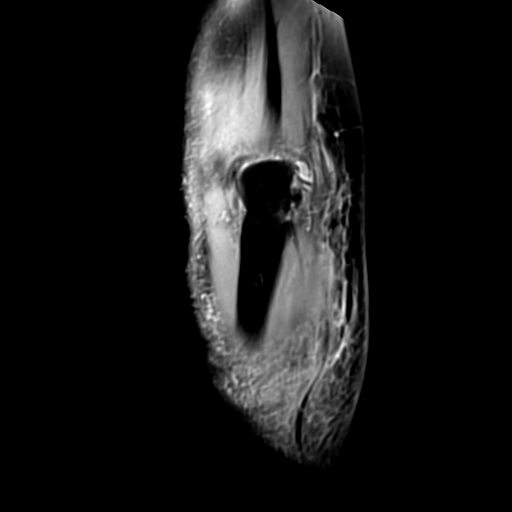
[im 6/11]
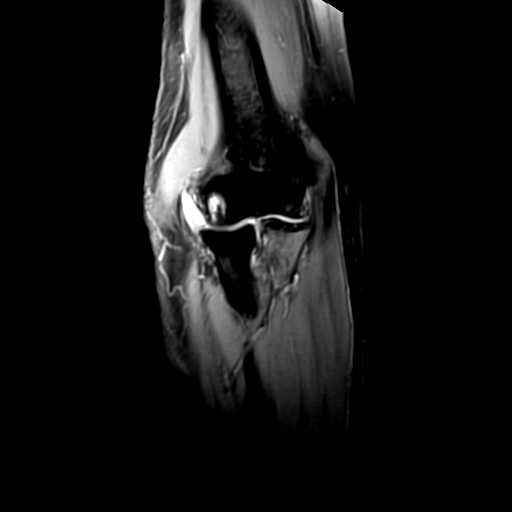
[im 8/11]
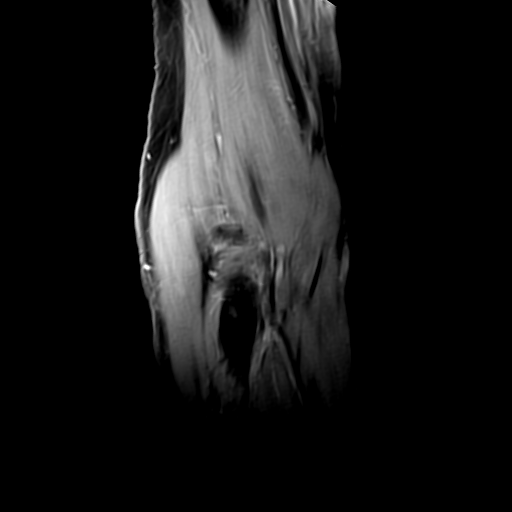
[im 11/11]
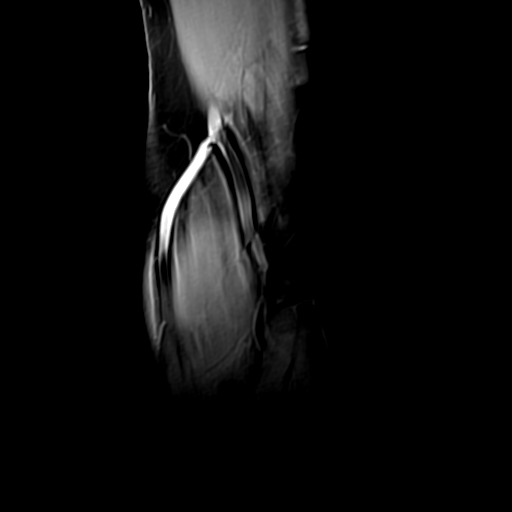

[Series 13: T2 fat-sat · oblique · 5.0mm · 0.39mm/px · 3 of 11 slices shown (2 of 2)]
[im 1/11]
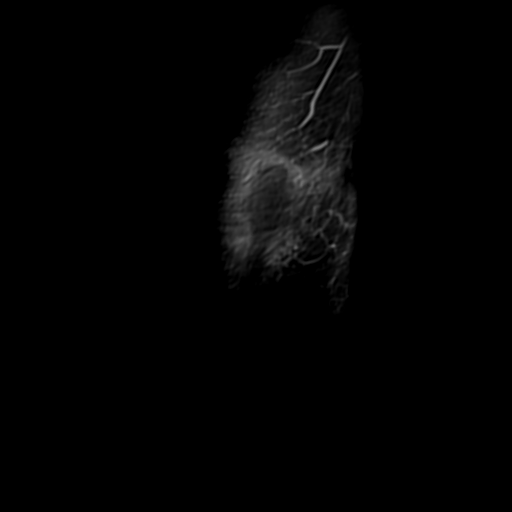
[im 6/11]
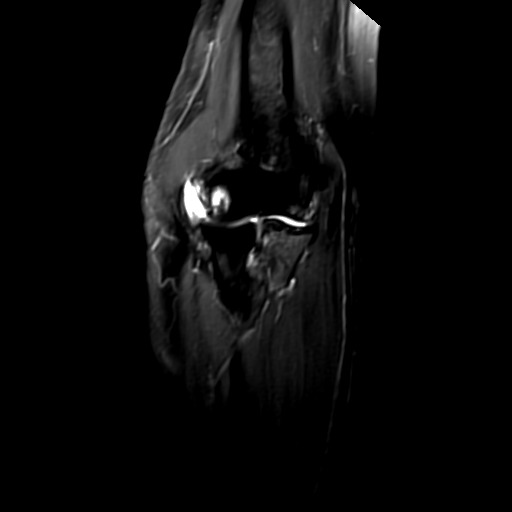
[im 11/11]
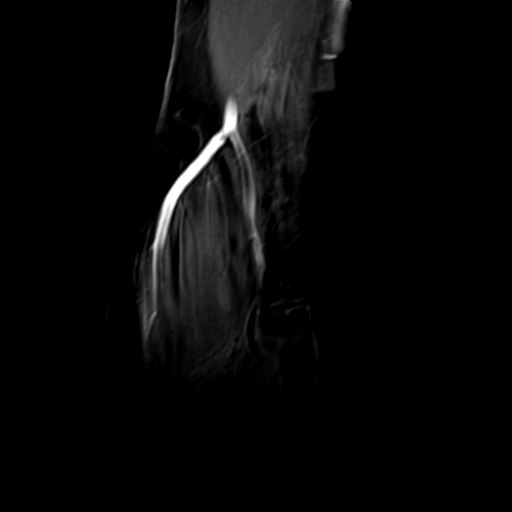

[Series 15: PD fat-sat · oblique · 4.0mm · 0.43mm/px · 8 of 18 slices shown (2 of 2)]
[im 1/18]
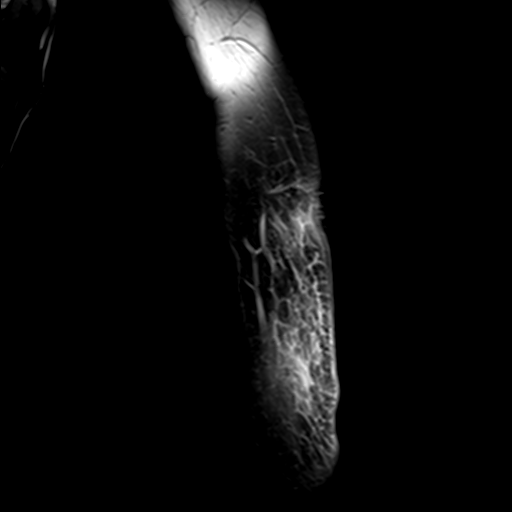
[im 3/18]
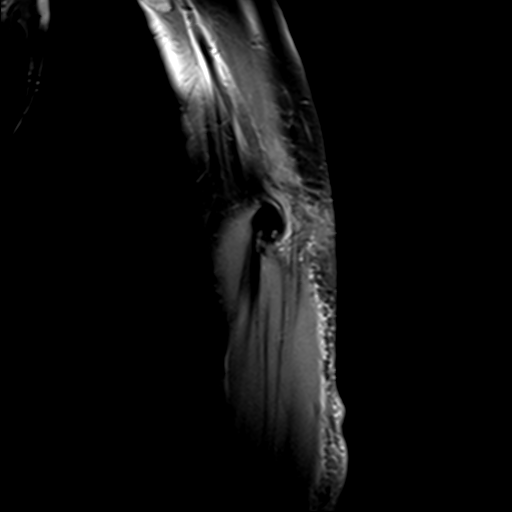
[im 5/18]
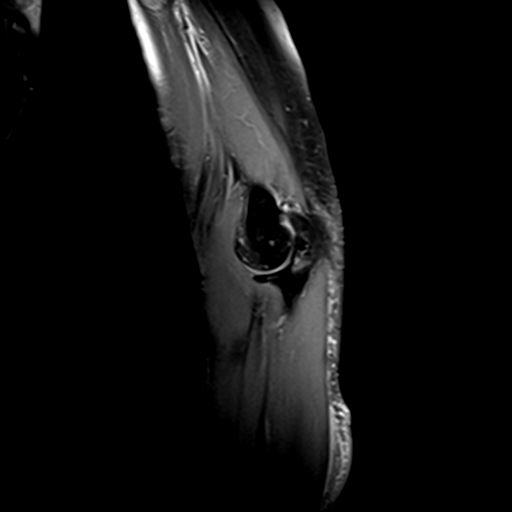
[im 8/18]
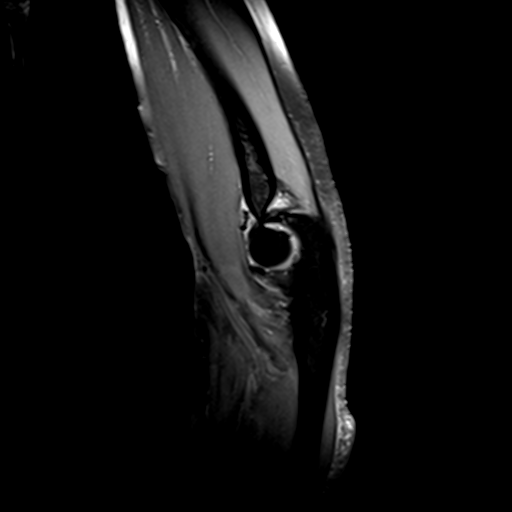
[im 10/18]
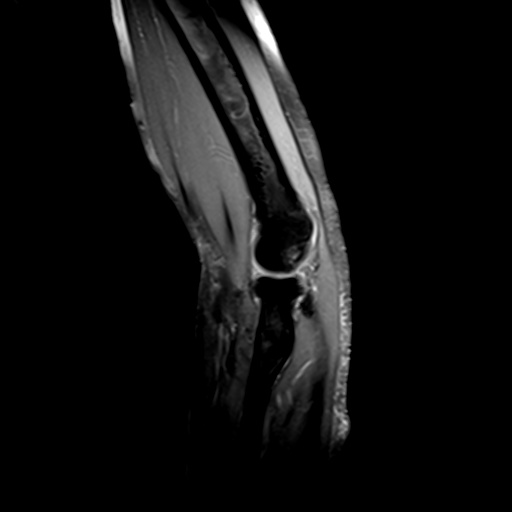
[im 13/18]
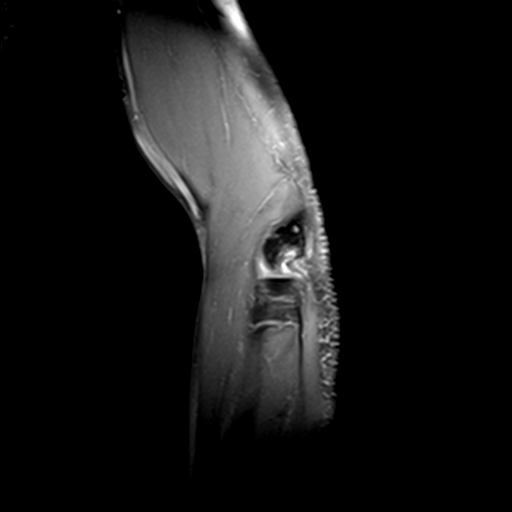
[im 15/18]
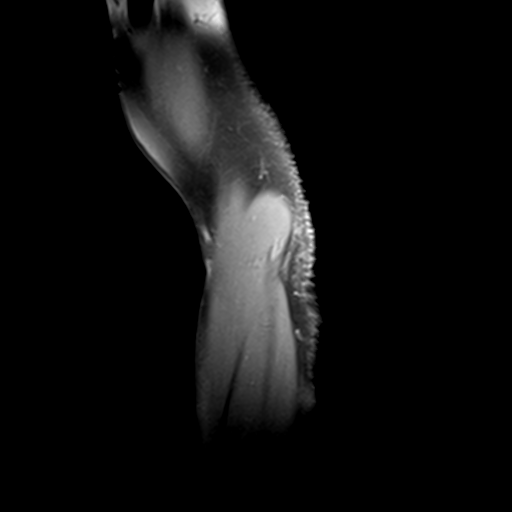
[im 18/18]
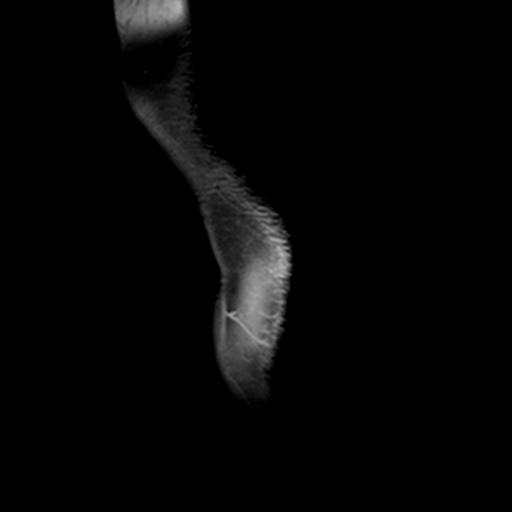

[23 of 40 positions shown; findings below may reference images not displayed]

FINDINGS: TENDONS

Common forearm flexor origin: There is a small focal intrasubstance
tear at the origin of the tendon.

Common forearm extensor origin: There is an extensive partial tear
of the origin of the tendon from the lateral epicondyle.

Biceps: There is degeneration and irregularity of the distal biceps
tendon at and just proximal to the radial tuberosity but there is no
discrete tear in the tendon is not retracted. There is no edema in
the distal biceps muscle.

Triceps: Normal.

LIGAMENTS

Medial stabilizers: Normal.

Lateral stabilizers: The radial collateral ligament is not
visualized. The lateral ulnar collateral ligament is intact.

Cartilage: Extensive grade 4 chondromalacia in the elbow joint with
numerous subcortical cysts and erosions throughout the joint.

Joint: Small joint effusion. There appear to be calcified loose
bodies in the joint.

Cubital tunnel: Ganglion cysts protrude into the cubital tunnel
immediately adjacent to the ulnar nerve. However, the signal
intensity of the ulnar nerve appears normal.

Bones: Diffuse arthritic changes with osteophyte formation and
subcortical cysts involving the capitellum and trochlea cells the
proximal ulna. Radial head appears normal.
IMPRESSION: 1. Degeneration of the distal biceps tendon without a tear.
2. Extensive partial tear of the origin of the common extensor
tendon at the lateral epicondyle.
3. Small intrasubstance tear of the origin of the common flexor
tendon at the medial epicondyle.
4. Fairly severe osteoarthritis of the elbow joint with loose
bodies.
5. Ganglion cysts protrude into cubital tunnel adjacent to the ulnar
nerve without abnormal signal from the nerve.

## 2016-05-08 DIAGNOSIS — F331 Major depressive disorder, recurrent, moderate: Secondary | ICD-10-CM | POA: Diagnosis not present

## 2016-05-21 DIAGNOSIS — L601 Onycholysis: Secondary | ICD-10-CM | POA: Diagnosis not present

## 2016-05-21 DIAGNOSIS — L608 Other nail disorders: Secondary | ICD-10-CM | POA: Diagnosis not present

## 2016-05-21 DIAGNOSIS — L661 Lichen planopilaris: Secondary | ICD-10-CM | POA: Diagnosis not present

## 2016-05-21 DIAGNOSIS — B351 Tinea unguium: Secondary | ICD-10-CM | POA: Diagnosis not present

## 2016-05-21 DIAGNOSIS — L669 Cicatricial alopecia, unspecified: Secondary | ICD-10-CM | POA: Diagnosis not present

## 2016-05-21 DIAGNOSIS — M7130 Other bursal cyst, unspecified site: Secondary | ICD-10-CM | POA: Diagnosis not present

## 2016-05-28 DIAGNOSIS — L661 Lichen planopilaris: Secondary | ICD-10-CM | POA: Diagnosis not present

## 2016-06-12 DIAGNOSIS — F331 Major depressive disorder, recurrent, moderate: Secondary | ICD-10-CM | POA: Diagnosis not present

## 2016-07-09 DIAGNOSIS — M25561 Pain in right knee: Secondary | ICD-10-CM | POA: Diagnosis not present

## 2016-07-22 DIAGNOSIS — F331 Major depressive disorder, recurrent, moderate: Secondary | ICD-10-CM | POA: Diagnosis not present

## 2016-08-17 DIAGNOSIS — H52203 Unspecified astigmatism, bilateral: Secondary | ICD-10-CM | POA: Diagnosis not present

## 2016-08-17 DIAGNOSIS — H35361 Drusen (degenerative) of macula, right eye: Secondary | ICD-10-CM | POA: Diagnosis not present

## 2016-08-17 DIAGNOSIS — Z79899 Other long term (current) drug therapy: Secondary | ICD-10-CM | POA: Diagnosis not present

## 2016-08-28 DIAGNOSIS — F331 Major depressive disorder, recurrent, moderate: Secondary | ICD-10-CM | POA: Diagnosis not present

## 2016-09-02 DIAGNOSIS — M25521 Pain in right elbow: Secondary | ICD-10-CM | POA: Diagnosis not present

## 2016-09-02 DIAGNOSIS — M6740 Ganglion, unspecified site: Secondary | ICD-10-CM | POA: Insufficient documentation

## 2016-09-02 DIAGNOSIS — M79644 Pain in right finger(s): Secondary | ICD-10-CM | POA: Diagnosis not present

## 2016-09-25 DIAGNOSIS — F331 Major depressive disorder, recurrent, moderate: Secondary | ICD-10-CM | POA: Diagnosis not present

## 2016-09-28 DIAGNOSIS — L661 Lichen planopilaris: Secondary | ICD-10-CM | POA: Diagnosis not present

## 2016-10-30 DIAGNOSIS — F331 Major depressive disorder, recurrent, moderate: Secondary | ICD-10-CM | POA: Diagnosis not present

## 2016-12-11 DIAGNOSIS — F331 Major depressive disorder, recurrent, moderate: Secondary | ICD-10-CM | POA: Diagnosis not present

## 2016-12-17 ENCOUNTER — Encounter: Payer: Self-pay | Admitting: Family Medicine

## 2016-12-17 DIAGNOSIS — Z1231 Encounter for screening mammogram for malignant neoplasm of breast: Secondary | ICD-10-CM | POA: Diagnosis not present

## 2016-12-17 DIAGNOSIS — M8589 Other specified disorders of bone density and structure, multiple sites: Secondary | ICD-10-CM | POA: Diagnosis not present

## 2016-12-23 DIAGNOSIS — Z23 Encounter for immunization: Secondary | ICD-10-CM | POA: Diagnosis not present

## 2017-01-03 DIAGNOSIS — L661 Lichen planopilaris: Secondary | ICD-10-CM | POA: Diagnosis not present

## 2017-01-12 DIAGNOSIS — Z0000 Encounter for general adult medical examination without abnormal findings: Secondary | ICD-10-CM | POA: Diagnosis not present

## 2017-01-12 DIAGNOSIS — L661 Lichen planopilaris: Secondary | ICD-10-CM | POA: Diagnosis not present

## 2017-01-12 DIAGNOSIS — Z1159 Encounter for screening for other viral diseases: Secondary | ICD-10-CM | POA: Diagnosis not present

## 2017-01-12 DIAGNOSIS — Z5181 Encounter for therapeutic drug level monitoring: Secondary | ICD-10-CM | POA: Diagnosis not present

## 2017-01-15 DIAGNOSIS — F331 Major depressive disorder, recurrent, moderate: Secondary | ICD-10-CM | POA: Diagnosis not present

## 2017-02-19 DIAGNOSIS — F331 Major depressive disorder, recurrent, moderate: Secondary | ICD-10-CM | POA: Diagnosis not present

## 2017-03-24 DIAGNOSIS — J029 Acute pharyngitis, unspecified: Secondary | ICD-10-CM | POA: Diagnosis not present

## 2017-03-24 DIAGNOSIS — Z9289 Personal history of other medical treatment: Secondary | ICD-10-CM | POA: Diagnosis not present

## 2017-03-24 DIAGNOSIS — J00 Acute nasopharyngitis [common cold]: Secondary | ICD-10-CM | POA: Diagnosis not present

## 2017-03-26 DIAGNOSIS — F331 Major depressive disorder, recurrent, moderate: Secondary | ICD-10-CM | POA: Diagnosis not present

## 2017-03-30 DIAGNOSIS — J209 Acute bronchitis, unspecified: Secondary | ICD-10-CM | POA: Diagnosis not present

## 2017-04-05 DIAGNOSIS — D1801 Hemangioma of skin and subcutaneous tissue: Secondary | ICD-10-CM | POA: Diagnosis not present

## 2017-04-05 DIAGNOSIS — L570 Actinic keratosis: Secondary | ICD-10-CM | POA: Diagnosis not present

## 2017-04-05 DIAGNOSIS — B0089 Other herpesviral infection: Secondary | ICD-10-CM | POA: Diagnosis not present

## 2017-04-05 DIAGNOSIS — L821 Other seborrheic keratosis: Secondary | ICD-10-CM | POA: Diagnosis not present

## 2017-04-05 DIAGNOSIS — L738 Other specified follicular disorders: Secondary | ICD-10-CM | POA: Diagnosis not present

## 2017-04-05 DIAGNOSIS — L661 Lichen planopilaris: Secondary | ICD-10-CM | POA: Diagnosis not present

## 2017-04-05 DIAGNOSIS — L820 Inflamed seborrheic keratosis: Secondary | ICD-10-CM | POA: Diagnosis not present

## 2017-04-23 DIAGNOSIS — F331 Major depressive disorder, recurrent, moderate: Secondary | ICD-10-CM | POA: Diagnosis not present

## 2017-05-28 DIAGNOSIS — F331 Major depressive disorder, recurrent, moderate: Secondary | ICD-10-CM | POA: Diagnosis not present

## 2017-06-16 DIAGNOSIS — M19012 Primary osteoarthritis, left shoulder: Secondary | ICD-10-CM | POA: Diagnosis not present

## 2017-06-16 DIAGNOSIS — M222X1 Patellofemoral disorders, right knee: Secondary | ICD-10-CM | POA: Diagnosis not present

## 2017-07-04 DIAGNOSIS — M25561 Pain in right knee: Secondary | ICD-10-CM | POA: Diagnosis not present

## 2017-07-04 DIAGNOSIS — M222X1 Patellofemoral disorders, right knee: Secondary | ICD-10-CM | POA: Diagnosis not present

## 2017-07-07 DIAGNOSIS — F331 Major depressive disorder, recurrent, moderate: Secondary | ICD-10-CM | POA: Diagnosis not present

## 2017-07-11 DIAGNOSIS — M222X1 Patellofemoral disorders, right knee: Secondary | ICD-10-CM | POA: Diagnosis not present

## 2017-07-11 DIAGNOSIS — M25561 Pain in right knee: Secondary | ICD-10-CM | POA: Diagnosis not present

## 2017-07-14 DIAGNOSIS — M67912 Unspecified disorder of synovium and tendon, left shoulder: Secondary | ICD-10-CM | POA: Diagnosis not present

## 2017-07-20 DIAGNOSIS — M25561 Pain in right knee: Secondary | ICD-10-CM | POA: Diagnosis not present

## 2017-07-20 DIAGNOSIS — M222X1 Patellofemoral disorders, right knee: Secondary | ICD-10-CM | POA: Diagnosis not present

## 2017-07-27 DIAGNOSIS — M25561 Pain in right knee: Secondary | ICD-10-CM | POA: Diagnosis not present

## 2017-07-27 DIAGNOSIS — M222X1 Patellofemoral disorders, right knee: Secondary | ICD-10-CM | POA: Diagnosis not present

## 2017-08-10 DIAGNOSIS — F331 Major depressive disorder, recurrent, moderate: Secondary | ICD-10-CM | POA: Diagnosis not present

## 2017-08-29 DIAGNOSIS — Z79899 Other long term (current) drug therapy: Secondary | ICD-10-CM | POA: Diagnosis not present

## 2017-08-29 DIAGNOSIS — H2513 Age-related nuclear cataract, bilateral: Secondary | ICD-10-CM | POA: Diagnosis not present

## 2017-08-29 DIAGNOSIS — H5203 Hypermetropia, bilateral: Secondary | ICD-10-CM | POA: Diagnosis not present

## 2017-08-29 DIAGNOSIS — H353131 Nonexudative age-related macular degeneration, bilateral, early dry stage: Secondary | ICD-10-CM | POA: Diagnosis not present

## 2017-09-15 DIAGNOSIS — F331 Major depressive disorder, recurrent, moderate: Secondary | ICD-10-CM | POA: Diagnosis not present

## 2017-09-21 DIAGNOSIS — L661 Lichen planopilaris: Secondary | ICD-10-CM | POA: Diagnosis not present

## 2017-09-21 DIAGNOSIS — Z79899 Other long term (current) drug therapy: Secondary | ICD-10-CM | POA: Diagnosis not present

## 2017-10-17 ENCOUNTER — Encounter (INDEPENDENT_AMBULATORY_CARE_PROVIDER_SITE_OTHER): Payer: Self-pay | Admitting: Family Medicine

## 2017-10-17 ENCOUNTER — Ambulatory Visit (INDEPENDENT_AMBULATORY_CARE_PROVIDER_SITE_OTHER): Payer: Medicare Other | Admitting: Family Medicine

## 2017-10-17 DIAGNOSIS — M25561 Pain in right knee: Secondary | ICD-10-CM | POA: Diagnosis not present

## 2017-10-17 MED ORDER — LIDOCAINE HCL 1 % IJ SOLN: 3.0000 mL | Freq: Once | INTRAMUSCULAR | Status: DC

## 2017-10-17 MED ORDER — METHYLPREDNISOLONE ACETATE 40 MG/ML IJ SUSP: 40.0000 mg | Freq: Once | INTRAMUSCULAR | Status: DC

## 2017-10-17 NOTE — Progress Notes (Signed)
Office Visit Note   Patient: Kathleen Daniels           Date of Birth: 04-06-1945           MRN: [id number] Visit Date: 10/17/2017              Requested by: Darcus Austin, MD [place] [address] [place], [place] [address] PCP: Darcus Austin, MD  S.  She presents today with chronic right knee pain.  Symptoms started about 9 or 10 months ago.  She was walking through her house and started feeling pain on the medial aspect of her knee.  She went to another orthopedic clinic and had x-rays done which were read as normal per her report.  She has been using Aleve and working with her physical therapist but her symptoms have persisted.  Pain on the medial aspect with no instability, no locking or catching.  Denies any hip pain or back pain associated with this.  Her knee feels full when she tries to flex it.  O: 1+ effusion with no warmth or erythema.  No pain with internal/external hip rotation.  Trace patellofemoral crepitus with no pain on patellar compression or apprehension.  Lachman's is solid, no laxity with varus/valgus stress.  Slight tenderness on the medial joint line but no palpable click with McMurray's test.  Assessment & Plan: Visit Diagnoses:  1. Right knee pain, unspecified chronicity   -Etiology uncertain but suspect mild arthritis with reactive effusion.  Cannot rule out degenerative meniscus tear.  Plan: Discussed options with patient and she would like to try a steroid injection.  If this does not help, then possibly an MRI scan.   Procedure: Right knee steroid injection Verbal consent was obtained.  After sterile prep with Betadine, injected 3 cc 1% lidocaine without epinephrine and 40 mg methylprednisolone from medial midpatellar approach, a flash of clear yellow synovial fluid was obtained prior to injection.  Follow-Up Instructions: No follow-ups on file.   Orders:  No orders of the defined types were placed in this encounter.  Meds ordered this encounter    Medications  . methylPREDNISolone acetate (DEPO-MEDROL) injection 40 mg  . lidocaine (XYLOCAINE) 1 % (with pres) injection 3 mL      Procedures: No procedures performed   Clinical Data: No additional findings.   Subjective: Chief Complaint  Patient presents with  . Right Knee - Pain    Pain and instability in right knee since January 2019. Several instances of twisting the knee since then - no improvement with PT and dry needling.    HPI  Review of Systems   Objective: Vital Signs: There were no vitals taken for this visit.  Physical Exam  Ortho Exam  Specialty Comments:  No specialty comments available.  Imaging: No results found.   PMFS History: Patient Active Problem List   Diagnosis Date Noted  . Traumatic partial tear of right biceps tendon 05/17/2014  . Depression 05/17/2014  . ADD (attention deficit disorder) 05/17/2014  . Right elbow pain 05/17/2014   History reviewed. No pertinent past medical history.  Family History  Problem Relation Age of Onset  . Colon cancer Neg Hx   . Pancreatic cancer Neg Hx   . Stomach cancer Neg Hx     Past Surgical History:  Procedure Laterality Date  . ABDOMINAL HYSTERECTOMY    . BREAST ENHANCEMENT SURGERY     Social History   Occupational History  . Not on file  Tobacco Use  .  Smoking status: Never Smoker  . Smokeless tobacco: Never Used  Substance and Sexual Activity  . Alcohol use: No  . Drug use: No  . Sexual activity: Not on file

## 2017-10-27 DIAGNOSIS — F331 Major depressive disorder, recurrent, moderate: Secondary | ICD-10-CM | POA: Diagnosis not present

## 2017-11-01 ENCOUNTER — Telehealth (INDEPENDENT_AMBULATORY_CARE_PROVIDER_SITE_OTHER): Payer: Self-pay | Admitting: Family Medicine

## 2017-11-01 NOTE — Telephone Encounter (Signed)
Patient called in regard to cortisone inj in knee two weeks ago. She is requesting a call back. [phone]

## 2017-11-02 NOTE — Telephone Encounter (Signed)
The patient reports her knee is approximately 75% better 2 weeks post cortisone injection.  There is still some pain and the knee still feels like it could have some fluid on it.  Is it likely she will continue to improve or is this the maximum result?  Please advise.

## 2017-11-02 NOTE — Telephone Encounter (Signed)
Patient advised.

## 2017-11-02 NOTE — Telephone Encounter (Signed)
There's still a chance it will keep improving, but I'm concerned that there might be injury to the meniscus cartilage.  If she stops getting better, I'd suggest an MRI next.

## 2017-11-11 ENCOUNTER — Encounter (INDEPENDENT_AMBULATORY_CARE_PROVIDER_SITE_OTHER): Payer: Self-pay | Admitting: Family Medicine

## 2017-11-11 ENCOUNTER — Ambulatory Visit (INDEPENDENT_AMBULATORY_CARE_PROVIDER_SITE_OTHER): Payer: Medicare Other | Admitting: Family Medicine

## 2017-11-11 DIAGNOSIS — M25561 Pain in right knee: Secondary | ICD-10-CM | POA: Diagnosis not present

## 2017-11-11 NOTE — Progress Notes (Signed)
   Office Visit Note   Patient: Kathleen Daniels           Date of Birth: Nov 28, 1945           MRN: [id number] Visit Date: 11/11/2017 Requested by: Darcus Austin, MD [place], [place] [address] PCP: Darcus Austin, MD  Subjective: Chief Complaint  Patient presents with  . Right Knee - Pain, Follow-up    Knee still feels "tight" - not sleeping well due to pain.    HPI: She is here with persistent right knee discomfort.  Injection helped quite a bit with pain, but she still has a feeling of tightness in the knee.  About a month ago she had a bug bite on [place] the [place].  She is still treating it with topical Bactroban and it is getting better, but she is concerned it might have something to do with swelling in her knee.  Denies any fever or chills.  She is no longer having instability symptoms in her knee.             ROS: Otherwise noncontributory.  Objective: Vital Signs: There were no vitals taken for this visit.  Physical Exam:  Right knee: 1+ effusion with slight warmth, no erythema.  Still slightly tender on the medial joint line.  Imaging: None today.  Assessment & Plan: 1.  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  Follow-up as needed.   Follow-Up Instructions: No follow-ups on file.     Procedures: Right knee aspiration: After sterile prep with Betadine, injected 5 cc 1% lidocaine without epinephrine and then aspirated 20 cc of blood-tinged synovial fluid.  Patient felt immediate relief.   PMFS History: Patient Active Problem List   Diagnosis Date Noted  . Traumatic partial tear of right biceps tendon 05/17/2014  . Depression 05/17/2014  . ADD (attention deficit disorder) 05/17/2014  . Right elbow pain 05/17/2014   No past medical history on file.  Family History  Problem Relation Age of Onset  . Colon cancer  Neg Hx   . Pancreatic cancer Neg Hx   . Stomach cancer Neg Hx     Past Surgical History:  Procedure Laterality Date  . ABDOMINAL HYSTERECTOMY    . BREAST ENHANCEMENT SURGERY     Social History   Occupational History  . Not on file  Tobacco Use  . Smoking status: Never Smoker  . Smokeless tobacco: Never Used  Substance and Sexual Activity  . Alcohol use: No  . Drug use: No  . Sexual activity: Not on file

## 2017-11-12 ENCOUNTER — Telehealth (INDEPENDENT_AMBULATORY_CARE_PROVIDER_SITE_OTHER): Payer: Self-pay | Admitting: Family Medicine

## 2017-11-12 NOTE — Telephone Encounter (Signed)
Synovial fluid contains calcium pyrophosphate crystals, which are found in a condition called "pseudogout".  https://my.[facility].org/health/diseases/4756-calcium-pyrophosphate-dihydrate-deposition-disease-cppd-or-pseudogout#targetText=Pseudogout%20(or%20"false%20gout",affects%20the%20knees%20and%20wrists.  This can cause periodic flare-ups of pain.    No sign of infection in the joint so far.  Culture will take a few days.  [place]

## 2017-11-17 LAB — ANAEROBIC AND AEROBIC CULTURE
AER RESULT: NO GROWTH
MICRO NUMBER: 91068330
MICRO NUMBER:: 91068331
SPECIMEN QUALITY:: ADEQUATE
SPECIMEN QUALITY:: ADEQUATE

## 2017-11-17 LAB — SYNOVIAL CELL COUNT + DIFF, W/ CRYSTALS
BASOPHILS, %: 0 %
Eosinophils-Synovial: 0 % (ref 0–2)
Lymphocytes-Synovial Fld: 33 % (ref 0–74)
Monocyte/Macrophage: 48 % (ref 0–69)
Neutrophil, Synovial: 19 % (ref 0–24)
SYNOVIOCYTES, %: 0 % (ref 0–15)
WBC, Synovial: 107 cells/uL (ref <150)

## 2017-12-08 DIAGNOSIS — F331 Major depressive disorder, recurrent, moderate: Secondary | ICD-10-CM | POA: Diagnosis not present

## 2017-12-20 DIAGNOSIS — Z23 Encounter for immunization: Secondary | ICD-10-CM | POA: Diagnosis not present

## 2017-12-22 DIAGNOSIS — Z1231 Encounter for screening mammogram for malignant neoplasm of breast: Secondary | ICD-10-CM | POA: Diagnosis not present

## 2018-01-09 ENCOUNTER — Ambulatory Visit (INDEPENDENT_AMBULATORY_CARE_PROVIDER_SITE_OTHER): Payer: Medicare Other

## 2018-01-09 ENCOUNTER — Ambulatory Visit (INDEPENDENT_AMBULATORY_CARE_PROVIDER_SITE_OTHER): Payer: Medicare Other | Admitting: Family Medicine

## 2018-01-09 ENCOUNTER — Encounter (INDEPENDENT_AMBULATORY_CARE_PROVIDER_SITE_OTHER): Payer: Self-pay | Admitting: Family Medicine

## 2018-01-09 DIAGNOSIS — M25561 Pain in right knee: Secondary | ICD-10-CM

## 2018-01-09 DIAGNOSIS — M25552 Pain in left hip: Secondary | ICD-10-CM | POA: Diagnosis not present

## 2018-01-09 MED ORDER — PREDNISONE 10 MG PO TABS: ORAL_TABLET | ORAL | 0 refills | Status: DC

## 2018-01-09 NOTE — Progress Notes (Signed)
   Office Visit Note   Patient: Kathleen Daniels           Date of Birth: June 21, 1945           MRN: [id number] Visit Date: 01/09/2018 Requested by: Darcus Austin, MD [place], [place] [address] PCP: Darcus Austin, MD  Subjective: Chief Complaint  Patient presents with  . Right Knee - Pain  . Left Hip - Pain    Lateral hip pain x 2 weeks.    HPI: She is here with left hip pain.  Also having a little bit of right knee pain.  Last time we aspirated her right knee and fluid results showed CPPD.  Her symptoms steadily improved after aspiration and she was doing very well until a couple days ago when she did a stretch of her hip and it aggravated the medial side of her knee.  Her left hip started bothering her a month or 2 ago when she was doing a yoga stretch.  Fairly sudden pain on the lateral aspect.  She had x-rays a few years ago and brought them for review.                ROS: Otherwise noncontributory  Objective: Vital Signs: There were no vitals taken for this visit.  Physical Exam:  Left hip: No significant pain with passive internal hip rotation.  She is tender primarily at the posterior superior greater trochanter. Right knee: Trace effusion, no warmth or erythema.  Tender on the medial joint line.  Full range of motion.  Imaging: X-rays brought with her on CD: From April 2019 knees have mild periarticular spurring in the tibiofemoral joint and possibly chondrocalcinosis.  Lumbar spine and pelvis x-rays, particularly looking at the left hip, show well-preserved hip joint space.  Left hip x-rays today: Probable calcification of the labrum cartilage consistent with chondrocalcinosis.  Early spurring of the femoral head inferiorly.  Assessment & Plan: 1.  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  Cannot rule out CPPD.  -Prednisone taper, follow-up as needed.  2.  Improving right knee pain -If symptoms  worsen, possible MRI scan.   Follow-Up Instructions: No follow-ups on file.       Procedures: None today.   PMFS History: Patient Active Problem List   Diagnosis Date Noted  . Traumatic partial tear of right biceps tendon 05/17/2014  . Depression 05/17/2014  . ADD (attention deficit disorder) 05/17/2014  . Right elbow pain 05/17/2014   History reviewed. No pertinent past medical history.  Family History  Problem Relation Age of Onset  . Colon cancer Neg Hx   . Pancreatic cancer Neg Hx   . Stomach cancer Neg Hx     Past Surgical History:  Procedure Laterality Date  . ABDOMINAL HYSTERECTOMY    . BREAST ENHANCEMENT SURGERY     Social History   Occupational History  . Not on file  Tobacco Use  . Smoking status: Never Smoker  . Smokeless tobacco: Never Used  Substance and Sexual Activity  . Alcohol use: No  . Drug use: No  . Sexual activity: Not on file

## 2018-01-12 DIAGNOSIS — F331 Major depressive disorder, recurrent, moderate: Secondary | ICD-10-CM | POA: Diagnosis not present

## 2018-02-01 DIAGNOSIS — Z0000 Encounter for general adult medical examination without abnormal findings: Secondary | ICD-10-CM | POA: Diagnosis not present

## 2018-02-01 DIAGNOSIS — F325 Major depressive disorder, single episode, in full remission: Secondary | ICD-10-CM | POA: Diagnosis not present

## 2018-02-01 DIAGNOSIS — Z79899 Other long term (current) drug therapy: Secondary | ICD-10-CM | POA: Diagnosis not present

## 2018-02-01 DIAGNOSIS — F909 Attention-deficit hyperactivity disorder, unspecified type: Secondary | ICD-10-CM | POA: Diagnosis not present

## 2018-02-18 DIAGNOSIS — F331 Major depressive disorder, recurrent, moderate: Secondary | ICD-10-CM | POA: Diagnosis not present

## 2018-02-24 DIAGNOSIS — J0100 Acute maxillary sinusitis, unspecified: Secondary | ICD-10-CM | POA: Diagnosis not present

## 2018-04-24 DIAGNOSIS — H353131 Nonexudative age-related macular degeneration, bilateral, early dry stage: Secondary | ICD-10-CM | POA: Diagnosis not present

## 2018-05-15 DIAGNOSIS — J988 Other specified respiratory disorders: Secondary | ICD-10-CM | POA: Diagnosis not present

## 2018-06-02 DIAGNOSIS — R05 Cough: Secondary | ICD-10-CM | POA: Diagnosis not present

## 2018-09-06 DIAGNOSIS — Z711 Person with feared health complaint in whom no diagnosis is made: Secondary | ICD-10-CM | POA: Diagnosis not present

## 2018-12-05 DIAGNOSIS — Z20828 Contact with and (suspected) exposure to other viral communicable diseases: Secondary | ICD-10-CM | POA: Diagnosis not present

## 2019-01-08 DIAGNOSIS — L433 Subacute (active) lichen planus: Secondary | ICD-10-CM | POA: Diagnosis not present

## 2019-01-08 DIAGNOSIS — D1801 Hemangioma of skin and subcutaneous tissue: Secondary | ICD-10-CM | POA: Diagnosis not present

## 2019-01-08 DIAGNOSIS — D485 Neoplasm of uncertain behavior of skin: Secondary | ICD-10-CM | POA: Diagnosis not present

## 2019-01-08 DIAGNOSIS — L821 Other seborrheic keratosis: Secondary | ICD-10-CM | POA: Diagnosis not present

## 2019-01-12 DIAGNOSIS — Z90710 Acquired absence of both cervix and uterus: Secondary | ICD-10-CM | POA: Diagnosis not present

## 2019-01-12 DIAGNOSIS — Z8262 Family history of osteoporosis: Secondary | ICD-10-CM | POA: Diagnosis not present

## 2019-01-12 DIAGNOSIS — Z1231 Encounter for screening mammogram for malignant neoplasm of breast: Secondary | ICD-10-CM | POA: Diagnosis not present

## 2019-01-12 DIAGNOSIS — M8589 Other specified disorders of bone density and structure, multiple sites: Secondary | ICD-10-CM | POA: Diagnosis not present

## 2019-01-12 DIAGNOSIS — R29890 Loss of height: Secondary | ICD-10-CM | POA: Diagnosis not present

## 2019-04-15 ENCOUNTER — Ambulatory Visit: Payer: Self-pay

## 2019-05-01 DIAGNOSIS — H5203 Hypermetropia, bilateral: Secondary | ICD-10-CM | POA: Diagnosis not present

## 2019-05-04 ENCOUNTER — Ambulatory Visit: Payer: Medicare Other

## 2019-05-09 ENCOUNTER — Other Ambulatory Visit: Payer: Self-pay

## 2019-05-09 ENCOUNTER — Ambulatory Visit: Payer: Self-pay

## 2019-05-09 ENCOUNTER — Encounter: Payer: Self-pay | Admitting: Family Medicine

## 2019-05-09 ENCOUNTER — Ambulatory Visit (INDEPENDENT_AMBULATORY_CARE_PROVIDER_SITE_OTHER): Payer: Medicare Other | Admitting: Family Medicine

## 2019-05-09 DIAGNOSIS — G8929 Other chronic pain: Secondary | ICD-10-CM

## 2019-05-09 DIAGNOSIS — M79644 Pain in right finger(s): Secondary | ICD-10-CM | POA: Diagnosis not present

## 2019-05-09 DIAGNOSIS — M25511 Pain in right shoulder: Secondary | ICD-10-CM

## 2019-05-09 DIAGNOSIS — M25512 Pain in left shoulder: Secondary | ICD-10-CM

## 2019-05-09 MED ORDER — HYDROCODONE-ACETAMINOPHEN 5-325 MG PO TABS: 1.0000 | ORAL_TABLET | Freq: Four times a day (QID) | ORAL | 0 refills | Status: DC | PRN

## 2019-05-09 NOTE — Progress Notes (Signed)
Office Visit Note   Patient: Kathleen Daniels           Date of Birth: June 12, 1945           MRN: [id number] Visit Date: 05/09/2019 Requested by: No referring provider defined for this encounter. PCP: Darcus Austin, MD (Inactive)  Subjective: Chief Complaint  Patient presents with  . Right Shoulder - Pain    Pain in the shoulder since cleaning out the refrigerator 1 weeks ago. Was stooping down and reaching out the arm to wipe the lower shelf.  . Left Shoulder - Pain    Pulling sensation in the trapezoid area and down to elbow x 1 month.  . Right Thumb - Pain    Pain right CMC after slamming the hand in a file cabinet drawer 1 year ago. Intermittent pain since then.    HPI: She is here with bilateral shoulder pain and right thumb pain.  Right shoulder started bothering her about a week ago when she was reaching awkwardly into a refrigerator to clean it.  Since then she feels like it is out of place.  It hurts to reach overhead.  Left shoulder has been bothering her for about a month, pain in the trapezius muscle with occasional radiation toward her elbow.  Right thumb was injured about a year ago when she slammed it in a cabinet drawer.  Seems to be getting slowly better but still has pain near the [facility] joint.              ROS:   All other systems were reviewed and are negative.  Objective: Vital Signs: There were no vitals taken for this visit.  Physical Exam:  General:  Alert and oriented, in no acute distress. Pulm:  Breathing unlabored. Psy:  Normal mood, congruent affect.  Shoulders: She holds her right shoulder lower than the left.  She has limited overhead reach on the right.  Palpation of the joint feels like her glenohumeral joint is intact with passive internal and external rotation of her shoulder.  She has significant weakness with active external rotation.  Supraspinatus feels intact. Left shoulder: Tender in the trapezius belly. Right hand: Tender near the first [facility]  joint.  No effusion, no crepitus.   Imaging: X-rays right shoulder: Anatomic alignment with no sign of dislocation or DJD.  X-rays right thumb: Moderate to severe first CMC arthrosis with radial subluxation of the joint.  No sign of fracture.   Assessment & Plan: 1.  Right shoulder pain, cannot rule out infraspinatus tear. -Referral to [facility]. -Ultrasound or MRI scan imaging if symptoms do not improve.  2.  Left shoulder myofascial pain -Edwena Felty to address this.  3.  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  Could inject with cortisone if symptoms worsen.     Procedures: No procedures performed  No notes on file     PMFS History: Patient Active Problem List   Diagnosis Date Noted  . Mucoid cyst of joint 09/02/2016  . Pain in finger of right hand 09/02/2016  . Traumatic partial tear of right biceps tendon 05/17/2014  . Depression 05/17/2014  . ADD (attention deficit disorder) 05/17/2014  . Right elbow pain 05/17/2014   History reviewed. No pertinent past medical history.  Family History  Problem Relation Age of Onset  . Colon cancer Neg Hx   . Pancreatic cancer Neg Hx   . Stomach cancer Neg Hx     Past Surgical History:  Procedure Laterality Date  . ABDOMINAL HYSTERECTOMY    . BREAST ENHANCEMENT SURGERY     Social History   Occupational History  . Not on file  Tobacco Use  . Smoking status: Never Smoker  . Smokeless tobacco: Never Used  Substance and Sexual Activity  . Alcohol use: No  . Drug use: No  . Sexual activity: Not on file

## 2019-06-21 DIAGNOSIS — Z0120 Encounter for dental examination and cleaning without abnormal findings: Secondary | ICD-10-CM | POA: Diagnosis not present

## 2019-06-25 DIAGNOSIS — Z13220 Encounter for screening for lipoid disorders: Secondary | ICD-10-CM | POA: Diagnosis not present

## 2019-06-25 DIAGNOSIS — Z5181 Encounter for therapeutic drug level monitoring: Secondary | ICD-10-CM | POA: Diagnosis not present

## 2019-06-25 DIAGNOSIS — J301 Allergic rhinitis due to pollen: Secondary | ICD-10-CM | POA: Diagnosis not present

## 2019-06-25 DIAGNOSIS — Z0000 Encounter for general adult medical examination without abnormal findings: Secondary | ICD-10-CM | POA: Diagnosis not present

## 2019-07-05 DIAGNOSIS — R002 Palpitations: Secondary | ICD-10-CM | POA: Diagnosis not present

## 2019-07-06 NOTE — Progress Notes (Signed)
Cardiology Office Note   Date:  07/10/2019   ID:  Andia, Perrin [birth year], MRN [id number]  PCP:  Maurice Small, MD  Cardiologist:   Beni Turrell [place], MD   Chief Complaint  Patient presents with  . Palpitations      History of Present Illness: Kathleen Daniels is a 74 y.o. female who is seen at the request of Dr. Justin Mend for evaluation of tachycardia. She states she has been in excellent health. One week ago she awoke in the morning and her heart was racing. By her BP monitor it was 175-180. She had no dizziness, syncope, chest pain, or dyspnea. It lasted about 90 minutes but then resolved. The next day she had an episode lasting less than 2 minutes and the next day 30 seconds. She has none since then. She drinks very little caffeine. She has been under increased stress. Her mother age 63 is in a SNF in [place] and she is driving back and forth weekly there.     History reviewed. No pertinent past medical history.  Past Surgical History:  Procedure Laterality Date  . ABDOMINAL HYSTERECTOMY    . BREAST ENHANCEMENT SURGERY       Current Outpatient Medications  Medication Sig Dispense Refill  . ALPRAZolam (XANAX) 0.5 MG tablet     . amphetamine-dextroamphetamine (ADDERALL) 20 MG tablet Take 20 mg by mouth daily. Take 1/2 tablet 3 times a day.    . B Complex Vitamins (VITAMIN B-COMPLEX PO) Take 1 tablet by mouth daily.    Marland Kitchen buPROPion (WELLBUTRIN XL) 150 MG 24 hr tablet     . chloroquine (ARALEN) 500 MG tablet   0  . Cholecalciferol (VITAMIN D-3) 1000 UNITS CAPS Take 1,000 Units by mouth.    . finasteride (PROSCAR) 5 MG tablet TK 1/2 T PO D  1  . glucosamine-chondroitin 500-400 MG tablet Take 1 tablet by mouth daily. 1.5gm/1.1gm    . HYDROcodone-acetaminophen (NORCO/VICODIN) 5-325 MG tablet Take 1 tablet by mouth every 6 (six) hours as needed for moderate pain. 30 tablet 0  . hydroxychloroquine (PLAQUENIL) 200 MG tablet TK 1 T PO BID  4  . triamcinolone cream (KENALOG) 0.1 %      . valACYclovir (VALTREX) 500 MG tablet   3   Current Facility-Administered Medications  Medication Dose Route Frequency Provider Last Rate Last Admin  . lidocaine (XYLOCAINE) 1 % (with pres) injection 3 mL  3 mL Infiltration Once Hilts, Michael, MD      . methylPREDNISolone acetate (DEPO-MEDROL) injection 40 mg  40 mg Intra-articular Once Hilts, Michael, MD        Allergies:   Penicillins and Tamiflu [oseltamivir phosphate]    Social History:  The patient  reports that she has never smoked. She has never used smokeless tobacco. She reports that she does not drink alcohol or use drugs.   Family History:  The patient's family history is not on file.    ROS:  Please see the history of present illness.   Otherwise, review of systems are positive for none.   All other systems are reviewed and negative.    PHYSICAL EXAM: VS:  BP 132/74   Pulse 80   Ht 5' 0.5" (1.537 m)   Wt 136 lb (61.7 kg)   SpO2 98%   BMI 26.12 kg/m  , BMI Body mass index is 26.12 kg/m. GEN: Well nourished, well developed, in no acute distress  HEENT: normal  Neck: no JVD, carotid bruits, or  masses Cardiac: RRR; no murmurs, rubs, or gallops,no edema  Respiratory:  clear to auscultation bilaterally, normal work of breathing GI: soft, nontender, nondistended, + BS MS: no deformity or atrophy  Skin: warm and dry, no rash Neuro:  Strength and sensation are intact Psych: euthymic mood, full affect   EKG:  EKG is ordered today. The ekg ordered today demonstrates NSR rate 80. Normal. I have personally reviewed and interpreted this study.    Recent Labs: No results found for requested labs within last 8760 hours.    Lipid Panel No results found for: CHOL, TRIG, HDL, CHOLHDL, VLDL, LDLCALC, LDLDIRECT   Dated 06/25/19: cholesterol 165, triglycerides 38, LDL 38, HDL 118. Hgb 12.6. chemistries and TSH normal.  Wt Readings from Last 3 Encounters:  07/10/19 136 lb (61.7 kg)  06/04/14 135 lb (61.2 kg)  05/16/14  135 lb (61.2 kg)      Other studies Reviewed: Additional studies/ records that were reviewed today include: none   ASSESSMENT AND PLAN:  1.  Tachypalpitations. Recent onset. No clear triggers. Otherwise she is in excellent health. Recent labs and Ecg normal. Cardiac exam is normal. We discussed the possible etiology including SVT versus AFib. We discussed having her wear a monitor to identify rhythm. For now she is comfortable with a wait and see approach. If arrhythmia recurs we will have her wear an event monitor. Discussed Valsalva maneuver to try and break tachycardia if it does happen again.   Current medicines are reviewed at length with the patient today.  The patient does not have concerns regarding medicines.  The following changes have been made:  no change  Labs/ tests ordered today include:  No orders of the defined types were placed in this encounter.    Disposition:   FU TBD if recurrent symptoms  Signed, Lashe Oliveira Martinique, MD  07/10/2019 3:41 PM    [facility] [address] [place], [place], [address] Phone [phone], Fax [phone]

## 2019-07-10 ENCOUNTER — Other Ambulatory Visit: Payer: Self-pay

## 2019-07-10 ENCOUNTER — Encounter: Payer: Self-pay | Admitting: Cardiology

## 2019-07-10 ENCOUNTER — Ambulatory Visit: Payer: Medicare Other | Admitting: Cardiology

## 2019-07-10 VITALS — BP 132/74 | HR 80 | Ht 60.5 in | Wt 136.0 lb

## 2019-07-10 DIAGNOSIS — R000 Tachycardia, unspecified: Secondary | ICD-10-CM

## 2019-07-19 ENCOUNTER — Telehealth: Payer: Self-pay | Admitting: Cardiology

## 2019-07-19 NOTE — Telephone Encounter (Signed)
Pt called to report that her HR via BP cuff has been 186 for about an hour. She denies dizziness but feels her heart beating "fast"... pt sounds SOB on the phone but says she is not feeling bad.   I advised her to try the valsalva maneuver and she says she has and tried to cough hard to see if she could get any relief but no reponse with her HR.Marland Kitchen Pt to have someone take her to the ED to be placed on the monitor and possible IV meds. Pt says she will not drive herself.

## 2019-07-19 NOTE — Telephone Encounter (Signed)
   STAT if HR is under 50 or over 120 (normal HR is 60-100 beats per minute)  1) What is your heart rate? 183 for almost an hour started at 8:20 am  2) Do you have a log of your heart rate readings (document readings)?   3) Do you have any other symptoms? Just feels like my heart is raising.

## 2019-10-23 ENCOUNTER — Other Ambulatory Visit: Payer: Self-pay

## 2019-10-23 DIAGNOSIS — R002 Palpitations: Secondary | ICD-10-CM

## 2019-10-23 NOTE — Telephone Encounter (Signed)
I would recommend she wear an event monitor to evaluate her palpitations.  Tyhir Schwan Martinique MD, [facility]

## 2019-11-08 ENCOUNTER — Ambulatory Visit (INDEPENDENT_AMBULATORY_CARE_PROVIDER_SITE_OTHER): Payer: Medicare Other

## 2019-11-08 ENCOUNTER — Encounter: Payer: Self-pay | Admitting: Cardiology

## 2019-11-08 DIAGNOSIS — R002 Palpitations: Secondary | ICD-10-CM | POA: Diagnosis not present

## 2019-11-08 DIAGNOSIS — M85859 Other specified disorders of bone density and structure, unspecified thigh: Secondary | ICD-10-CM | POA: Diagnosis not present

## 2019-11-14 ENCOUNTER — Telehealth: Payer: Self-pay | Admitting: Cardiology

## 2019-11-14 NOTE — Telephone Encounter (Signed)
    Kathleen Daniels from [employer] calling to give report for a serious EKG, while on hold calling triage , line disconnected

## 2019-11-15 ENCOUNTER — Telehealth: Payer: Self-pay

## 2019-11-15 NOTE — Telephone Encounter (Signed)
Received monitor alert from [employer] for auto trigger 11/14/2019 at 11:56 02am and 115:56 19am, SVT with rates of 186.9 and 198.0.  Pt reports she did feel as if her "heart was in her throat" but denies additional symptoms of dizziness, syncope or CP.  Monitor alert taken to Dr Lovena Le.  Dr Lovena Le would like for pt to be seen by himself.  Monitor alert given to Bethann Humble, RN as she states she will contact pt re: need for appointment.

## 2019-11-16 ENCOUNTER — Encounter: Payer: Self-pay | Admitting: Internal Medicine

## 2019-11-16 ENCOUNTER — Ambulatory Visit: Payer: Medicare Other | Admitting: Internal Medicine

## 2019-11-16 ENCOUNTER — Other Ambulatory Visit: Payer: Self-pay

## 2019-11-16 DIAGNOSIS — I471 Supraventricular tachycardia: Secondary | ICD-10-CM | POA: Diagnosis not present

## 2019-11-16 NOTE — Patient Instructions (Addendum)
Medication Instructions:  Your physician recommends that you continue on your current medications as directed. Please refer to the Current Medication list given to you today.  Labwork: None ordered.  Testing/Procedures: Your physician has recommended that you have an ablation. Catheter ablation is a medical procedure used to treat some cardiac arrhythmias (irregular heartbeats). During catheter ablation, a long, thin, flexible tube is put into a blood vessel in your groin (upper thigh), or neck. This tube is called an ablation catheter. It is then guided to your heart through the blood vessel. Radio frequency waves destroy small areas of heart tissue where abnormal heartbeats may cause an arrhythmia to start. Please see the instruction sheet given to you today.   Follow-Up:  The following dates are available for procedures: September 24, 27, 29 October 11, 18, 21, 25  Contact Sonia Baller RN if you decide you would like to proceed!   Any Other Special Instructions Will Be Listed Below (If Applicable).  If you need a refill on your cardiac medications before your next appointment, please call your pharmacy.    Cardiac Ablation Cardiac ablation is a procedure to disable (ablate) a small amount of heart tissue in very specific places. The heart has many electrical connections. Sometimes these connections are abnormal and can cause the heart to beat very fast or irregularly. Ablating some of the problem areas can improve the heart rhythm or return it to normal. Ablation may be done for people who:  Have Wolff-Parkinson-White syndrome.  Have fast heart rhythms (tachycardia).  Have taken medicines for an abnormal heart rhythm (arrhythmia) that were not effective or caused side effects.  Have a high-risk heartbeat that may be life-threatening. During the procedure, a small incision is made in the neck or the groin, and a long, thin, flexible tube (catheter) is inserted into the incision and moved  to the heart. Small devices (electrodes) on the tip of the catheter will send out electrical currents. A type of X-ray (fluoroscopy) will be used to help guide the catheter and to provide images of the heart. Tell a health care provider about:  Any allergies you have.  All medicines you are taking, including vitamins, herbs, eye drops, creams, and over-the-counter medicines.  Any problems you or family members have had with anesthetic medicines.  Any blood disorders you have.  Any surgeries you have had.  Any medical conditions you have, such as kidney failure.  Whether you are pregnant or may be pregnant. What are the risks? Generally, this is a safe procedure. However, problems may occur, including:  Infection.  Bruising and bleeding at the catheter insertion site.  Bleeding into the chest, especially into the sac that surrounds the heart. This is a serious complication.  Stroke or blood clots.  Damage to other structures or organs.  Allergic reaction to medicines or dyes.  Need for a permanent pacemaker if the normal electrical system is damaged. A pacemaker is a small computer that sends electrical signals to the heart and helps your heart beat normally.  The procedure not being fully effective. This may not be recognized until months later. Repeat ablation procedures are sometimes required. What happens before the procedure?  Follow instructions from your health care provider about eating or drinking restrictions.  Ask your health care provider about: ? Changing or stopping your regular medicines. This is especially important if you are taking diabetes medicines or blood thinners. ? Taking medicines such as aspirin and ibuprofen. These medicines can thin your blood. Do not  take these medicines before your procedure if your health care provider instructs you not to.  Plan to have someone take you home from the hospital or clinic.  If you will be going home right after  the procedure, plan to have someone with you for 24 hours. What happens during the procedure?  To lower your risk of infection: ? Your health care team will wash or sanitize their hands. ? Your skin will be washed with soap. ? Hair may be removed from the incision area.  An IV tube will be inserted into one of your veins.  You will be given a medicine to help you relax (sedative).  The skin on your neck or groin will be numbed.  An incision will be made in your neck or your groin.  A needle will be inserted through the incision and into a large vein in your neck or groin.  A catheter will be inserted into the needle and moved to your heart.  Dye may be injected through the catheter to help your surgeon see the area of the heart that needs treatment.  Electrical currents will be sent from the catheter to ablate heart tissue in desired areas. There are three types of energy that may be used to ablate heart tissue: ? Heat (radiofrequency energy). ? Laser energy. ? Extreme cold (cryoablation).  When the necessary tissue has been ablated, the catheter will be removed.  Pressure will be held on the catheter insertion area to prevent excessive bleeding.  A bandage (dressing) will be placed over the catheter insertion area. The procedure may vary among health care providers and hospitals. What happens after the procedure?  Your blood pressure, heart rate, breathing rate, and blood oxygen level will be monitored until the medicines you were given have worn off.  Your catheter insertion area will be monitored for bleeding. You will need to lie still for a few hours to ensure that you do not bleed from the catheter insertion area.  Do not drive for 24 hours or as long as directed by your health care provider. Summary  Cardiac ablation is a procedure to disable (ablate) a small amount of heart tissue in very specific places. Ablating some of the problem areas can improve the heart  rhythm or return it to normal.  During the procedure, electrical currents will be sent from the catheter to ablate heart tissue in desired areas. This information is not intended to replace advice given to you by your health care provider. Make sure you discuss any questions you have with your health care provider. Document Revised: 08/15/2017 Document Reviewed: 01/12/2016 Elsevier Patient Education  [place].

## 2019-11-16 NOTE — Progress Notes (Signed)
HPI Kathleen Daniels is referred today by Cecilie Kicks for evaluation of SVT.  She is a very pleasant 74 year old woman with a history of palpitations for over a year.  These episodes have increased in frequency and severity over the last 6 months.  She does not have much in the way of symptoms despite documented heart rates of 200 bpm.  She does note that the episodes start and stop suddenly.  She has very mild shortness of breath.  She denies chest pain or syncope.  She does experience palpitations.  The spells are not associated with activity or position.  She has never had to go to the emergency room.  Her SVT was documented on a cardiac monitor. Allergies  Allergen Reactions  . Penicillins Hives and Swelling    Facial swelling  . Tamiflu [Oseltamivir Phosphate]      Current Outpatient Medications  Medication Sig Dispense Refill  . ALPRAZolam (XANAX) 0.5 MG tablet Take 0.5 mg by mouth at bedtime.     Marland Kitchen amphetamine-dextroamphetamine (ADDERALL) 20 MG tablet Take 20 mg by mouth daily. Take 1/2 tablet 3 times a day.    . B Complex Vitamins (VITAMIN B-COMPLEX PO) Take 1 tablet by mouth daily.    Marland Kitchen buPROPion (WELLBUTRIN XL) 150 MG 24 hr tablet Take 150 mg by mouth 3 (three) times daily.     . Cholecalciferol (VITAMIN D-3) 1000 UNITS CAPS Take 1,000 Units by mouth daily.     . finasteride (PROSCAR) 5 MG tablet Take 5 mg by mouth daily.   1  . HYDROcodone-acetaminophen (NORCO/VICODIN) 5-325 MG tablet Take 1 tablet by mouth every 6 (six) hours as needed for moderate pain. 30 tablet 0  . hydroxychloroquine (PLAQUENIL) 200 MG tablet TK 1 T PO BID  4  . valACYclovir (VALTREX) 500 MG tablet Take 500 mg by mouth as needed. Use as directed  3   Current Facility-Administered Medications  Medication Dose Route Frequency Provider Last Rate Last Admin  . lidocaine (XYLOCAINE) 1 % (with pres) injection 3 mL  3 mL Infiltration Once Hilts, Michael, MD      . methylPREDNISolone acetate (DEPO-MEDROL)  injection 40 mg  40 mg Intra-articular Once Hilts, Legrand Como, MD         Past Medical History:  Diagnosis Date  . ADHD   . Alopecia   . Arthritis   . Depression     ROS:   All systems reviewed and negative except as noted in the HPI.   Past Surgical History:  Procedure Laterality Date  . ABDOMINAL HYSTERECTOMY    . BREAST ENHANCEMENT SURGERY       Family History  Problem Relation Age of Onset  . Colon cancer Neg Hx   . Pancreatic cancer Neg Hx   . Stomach cancer Neg Hx      Social History   Socioeconomic History  . Marital status: Divorced    Spouse name: Not on file  . Number of children: Not on file  . Years of education: Not on file  . Highest education level: Not on file  Occupational History  . Not on file  Tobacco Use  . Smoking status: Never Smoker  . Smokeless tobacco: Never Used  Substance and Sexual Activity  . Alcohol use: No  . Drug use: No  . Sexual activity: Not on file  Other Topics Concern  . Not on file  Social History Narrative  . Not on file   Social Determinants of Health  Financial Resource Strain:   . Difficulty of Paying Living Expenses: Not on file  Food Insecurity:   . Worried About Charity fundraiser in the Last Year: Not on file  . Ran Out of Food in the Last Year: Not on file  Transportation Needs:   . Lack of Transportation (Medical): Not on file  . Lack of Transportation (Non-Medical): Not on file  Physical Activity:   . Days of Exercise per Week: Not on file  . Minutes of Exercise per Session: Not on file  Stress:   . Feeling of Stress : Not on file  Social Connections:   . Frequency of Communication with Friends and Family: Not on file  . Frequency of Social Gatherings with Friends and Family: Not on file  . Attends Religious Services: Not on file  . Active Member of Clubs or Organizations: Not on file  . Attends Archivist Meetings: Not on file  . Marital Status: Not on file  Intimate Partner  Violence:   . Fear of Current or Ex-Partner: Not on file  . Emotionally Abused: Not on file  . Physically Abused: Not on file  . Sexually Abused: Not on file     BP 116/74   Pulse 60   Ht 5' 0.5" (1.537 m)   Wt 137 lb 12.8 oz (62.5 kg)   SpO2 97%   BMI 26.47 kg/m   Physical Exam:  Well appearing 74 year old woman, NAD HEENT: Unremarkable Neck: 6 cm JVD, no thyromegally Lymphatics:  No adenopathy Back:  No CVA tenderness Lungs:  Clear, with no wheezes, rales, or rhonchi HEART:  Regular rate rhythm, no murmurs, no rubs, no clicks Abd:  soft, positive bowel sounds, no organomegally, no rebound, no guarding Ext:  2 plus pulses, no edema, no cyanosis, no clubbing Skin:  No rashes no nodules Neuro:  CN II through XII intact, motor grossly intact  EKG -normal sinus rhythm with no ventricular preexcitation   Assess/Plan: 1.  SVT -the patient has had SVT at 200 bpm.  She is not particularly interested in medical therapy as she has low blood pressure and has experienced beta-blockers in the past.  I have discussed the treatment options in detail.  The risks, goals, benefits, and expectations of catheter ablation were reviewed.  She will call us if she would like to proceed with ablation.  Cristopher Peru, MD

## 2019-11-19 DIAGNOSIS — Z20822 Contact with and (suspected) exposure to covid-19: Secondary | ICD-10-CM | POA: Diagnosis not present

## 2019-11-21 ENCOUNTER — Telehealth: Payer: Self-pay | Admitting: Internal Medicine

## 2019-11-21 NOTE — Telephone Encounter (Signed)
Kathleen Daniels is calling requesting to speak with Kathleen Daniels due to being advised to do so by Dr. Lovena Le to schedule an ablation. For further information please see 11/21/19 pt message. Please advise.

## 2019-11-21 NOTE — Telephone Encounter (Signed)
Returned call to pt.  Discussed cardiac ablation.  Answered all [name] questions.  She will consider ablation and will message this nurse via Kathleen Daniels when she has made a decision.

## 2019-11-27 ENCOUNTER — Telehealth: Payer: Self-pay

## 2019-11-27 DIAGNOSIS — I471 Supraventricular tachycardia: Secondary | ICD-10-CM

## 2019-11-27 NOTE — Telephone Encounter (Signed)
Pt scheduled for SVT ablation on 12/17/19  Lab/covid test scheduled  Work up complete

## 2019-12-13 ENCOUNTER — Telehealth: Payer: Self-pay | Admitting: Cardiology

## 2019-12-13 NOTE — Telephone Encounter (Signed)
    Pt is returning Kathleen Daniels's call for her heart monitor result

## 2019-12-13 NOTE — Telephone Encounter (Signed)
Spoke to patient she stated she wanted to make sure Dr.Jordan agreed with her having a ablation.Stated she only felt one episode of fast heart beat.Stated she was surprised she was referred to Dr.Taylor.Stated he recommended having a ablation instead of trying medication first.She would like Dr.Jordan's opinion.Advised Dr.Jordan is out of office today.I will speak to him tomorrow and call you back.

## 2019-12-14 ENCOUNTER — Other Ambulatory Visit: Payer: Medicare Other | Admitting: *Deleted

## 2019-12-14 ENCOUNTER — Other Ambulatory Visit: Payer: Self-pay

## 2019-12-14 ENCOUNTER — Other Ambulatory Visit (HOSPITAL_COMMUNITY)
Admission: RE | Admit: 2019-12-14 | Discharge: 2019-12-14 | Disposition: A | Discharge status: Home / Self Care | Payer: Medicare Other | Source: Ambulatory Visit | Attending: Internal Medicine | Admitting: Internal Medicine

## 2019-12-14 DIAGNOSIS — Z20822 Contact with and (suspected) exposure to covid-19: Secondary | ICD-10-CM | POA: Diagnosis not present

## 2019-12-14 DIAGNOSIS — Z01812 Encounter for preprocedural laboratory examination: Secondary | ICD-10-CM | POA: Diagnosis not present

## 2019-12-14 DIAGNOSIS — I471 Supraventricular tachycardia: Secondary | ICD-10-CM

## 2019-12-14 LAB — SARS CORONAVIRUS 2 (TAT 6-24 HRS): SARS Coronavirus 2: NEGATIVE

## 2019-12-14 NOTE — Telephone Encounter (Signed)
Called patient left message on personal voice mail Dr.Jordan agrees with having ablation.

## 2019-12-14 NOTE — Progress Notes (Signed)
Instructed patient on the following items: Arrival time 0730 Nothing to eat or drink after midnight No meds AM of procedure Responsible person to drive you home and stay with you for 24 hrs     

## 2019-12-15 LAB — BASIC METABOLIC PANEL
BUN/Creatinine Ratio: 25 (ref 12–28)
BUN: 19 mg/dL (ref 8–27)
CO2: 22 mmol/L (ref 20–29)
Calcium: 9.7 mg/dL (ref 8.7–10.3)
Chloride: 99 mmol/L (ref 96–106)
Creatinine, Ser: 0.77 mg/dL (ref 0.57–1.00)
GFR calc Af Amer: 89 mL/min/{1.73_m2} (ref >59)
GFR calc non Af Amer: 77 mL/min/{1.73_m2} (ref >59)
Glucose: 94 mg/dL (ref 65–99)
Potassium: 4.4 mmol/L (ref 3.5–5.2)
Sodium: 136 mmol/L (ref 134–144)

## 2019-12-15 LAB — CBC WITH DIFFERENTIAL/PLATELET
Basophils Absolute: 0.1 10*3/uL (ref 0.0–0.2)
Basos: 2 %
EOS (ABSOLUTE): 0 10*3/uL (ref 0.0–0.4)
Eos: 1 %
Hematocrit: 37.4 % (ref 34.0–46.6)
Hemoglobin: 12.6 g/dL (ref 11.1–15.9)
Immature Grans (Abs): 0 10*3/uL (ref 0.0–0.1)
Immature Granulocytes: 0 %
Lymphocytes Absolute: 1.8 10*3/uL (ref 0.7–3.1)
Lymphs: 33 %
MCH: 30.7 pg (ref 26.6–33.0)
MCHC: 33.7 g/dL (ref 31.5–35.7)
MCV: 91 fL (ref 79–97)
Monocytes Absolute: 0.5 10*3/uL (ref 0.1–0.9)
Monocytes: 10 %
Neutrophils Absolute: 3 10*3/uL (ref 1.4–7.0)
Neutrophils: 54 %
Platelets: 298 10*3/uL (ref 150–450)
RBC: 4.11 x10E6/uL (ref 3.77–5.28)
RDW: 11.8 % (ref 11.7–15.4)
WBC: 5.5 10*3/uL (ref 3.4–10.8)

## 2019-12-17 ENCOUNTER — Ambulatory Visit (HOSPITAL_COMMUNITY)
Admission: RE | Admit: 2019-12-17 | Discharge: 2019-12-17 | Disposition: A | Discharge status: Home / Self Care | Payer: Medicare Other | Attending: Internal Medicine | Admitting: Internal Medicine

## 2019-12-17 ENCOUNTER — Ambulatory Visit (HOSPITAL_COMMUNITY)
Admission: RE | Disposition: A | Discharge status: Home / Self Care | Payer: Medicare Other | Source: Home / Self Care | Attending: Internal Medicine

## 2019-12-17 DIAGNOSIS — F909 Attention-deficit hyperactivity disorder, unspecified type: Secondary | ICD-10-CM | POA: Insufficient documentation

## 2019-12-17 DIAGNOSIS — Z79899 Other long term (current) drug therapy: Secondary | ICD-10-CM | POA: Insufficient documentation

## 2019-12-17 DIAGNOSIS — Z887 Allergy status to serum and vaccine status: Secondary | ICD-10-CM | POA: Diagnosis not present

## 2019-12-17 DIAGNOSIS — I471 Supraventricular tachycardia: Secondary | ICD-10-CM | POA: Diagnosis not present

## 2019-12-17 DIAGNOSIS — F32A Depression, unspecified: Secondary | ICD-10-CM | POA: Insufficient documentation

## 2019-12-17 DIAGNOSIS — Z90710 Acquired absence of both cervix and uterus: Secondary | ICD-10-CM | POA: Insufficient documentation

## 2019-12-17 DIAGNOSIS — M1990 Unspecified osteoarthritis, unspecified site: Secondary | ICD-10-CM | POA: Diagnosis not present

## 2019-12-17 DIAGNOSIS — Z880 Allergy status to penicillin: Secondary | ICD-10-CM | POA: Insufficient documentation

## 2019-12-17 HISTORY — PX: SVT ABLATION: EP1225

## 2019-12-17 LAB — POCT ACTIVATED CLOTTING TIME
Activated Clotting Time: 191 seconds
Activated Clotting Time: 197 seconds
Activated Clotting Time: 180 seconds

## 2019-12-17 SURGERY — SVT ABLATION

## 2019-12-17 MED ORDER — SODIUM CHLORIDE 0.9 % IV SOLN: INTRAVENOUS | Status: DC

## 2019-12-17 MED ORDER — HEPARIN (PORCINE) IN NACL 1000-0.9 UT/500ML-% IV SOLN
INTRAVENOUS | Status: AC
  Filled 2019-12-17: qty 500

## 2019-12-17 MED ORDER — MIDAZOLAM HCL 5 MG/5ML IJ SOLN
INTRAMUSCULAR | Status: AC
  Filled 2019-12-17: qty 5

## 2019-12-17 MED ORDER — FENTANYL CITRATE (PF) 100 MCG/2ML IJ SOLN
INTRAMUSCULAR | Status: AC
  Filled 2019-12-17: qty 2

## 2019-12-17 MED ORDER — SODIUM CHLORIDE 0.9% FLUSH: 3.0000 mL | Freq: Two times a day (BID) | INTRAVENOUS | Status: DC

## 2019-12-17 MED ORDER — HEPARIN (PORCINE) IN NACL 1000-0.9 UT/500ML-% IV SOLN
INTRAVENOUS | Status: DC | PRN
  Administered 2019-12-17: 500 mL

## 2019-12-17 MED ORDER — LIDOCAINE HCL (PF) 1 % IJ SOLN
INTRAMUSCULAR | Status: DC | PRN
  Administered 2019-12-17: 60 mL

## 2019-12-17 MED ORDER — FENTANYL CITRATE (PF) 100 MCG/2ML IJ SOLN
INTRAMUSCULAR | Status: DC | PRN
Start: 2019-12-17 — End: 2019-12-17
  Administered 2019-12-17 (×3): 12.5 ug via INTRAVENOUS
  Administered 2019-12-17 (×2): 25 ug via INTRAVENOUS
  Administered 2019-12-17: 12.5 ug via INTRAVENOUS
  Administered 2019-12-17: 25 ug via INTRAVENOUS

## 2019-12-17 MED ORDER — SODIUM CHLORIDE 0.9 % IV SOLN: 250.0000 mL | INTRAVENOUS | Status: DC | PRN

## 2019-12-17 MED ORDER — BUPIVACAINE HCL (PF) 0.25 % IJ SOLN
INTRAMUSCULAR | Status: AC
  Filled 2019-12-17: qty 60

## 2019-12-17 MED ORDER — HEPARIN SODIUM (PORCINE) 1000 UNIT/ML IJ SOLN
INTRAMUSCULAR | Status: AC
  Filled 2019-12-17: qty 1

## 2019-12-17 MED ORDER — MIDAZOLAM HCL 5 MG/5ML IJ SOLN
INTRAMUSCULAR | Status: DC | PRN
  Administered 2019-12-17: 1 mg via INTRAVENOUS
  Administered 2019-12-17: 2 mg via INTRAVENOUS
  Administered 2019-12-17 (×2): 1 mg via INTRAVENOUS
  Administered 2019-12-17 (×2): 2 mg via INTRAVENOUS
  Administered 2019-12-17: 1 mg via INTRAVENOUS

## 2019-12-17 MED ORDER — SODIUM CHLORIDE 0.9% FLUSH: 3.0000 mL | INTRAVENOUS | Status: DC | PRN

## 2019-12-17 MED ORDER — ONDANSETRON HCL 4 MG/2ML IJ SOLN: 4.0000 mg | Freq: Four times a day (QID) | INTRAMUSCULAR | Status: DC | PRN

## 2019-12-17 MED ORDER — HEPARIN SODIUM (PORCINE) 1000 UNIT/ML IJ SOLN
INTRAMUSCULAR | Status: DC | PRN
  Administered 2019-12-17: 2000 [IU] via INTRAVENOUS
  Administered 2019-12-17: 5000 [IU] via INTRAVENOUS

## 2019-12-17 MED ORDER — ACETAMINOPHEN 325 MG PO TABS
650.0000 mg | ORAL_TABLET | ORAL | Status: DC | PRN
  Filled 2019-12-17: qty 2

## 2019-12-17 SURGICAL SUPPLY — 12 items
BAG SNAP BAND KOVER 36X36 (MISCELLANEOUS) ×1 IMPLANT
CATH BLAZER 7FR 4MM LG 5031TK2 (ABLATOR) ×1 IMPLANT
CATH CELSIUS THERMO F CV 7FR (ABLATOR) ×1 IMPLANT
CATH HEX JOS 2-5-2 65CM 6F REP (CATHETERS) ×1 IMPLANT
CATH JOSEPH QUAD ALLRED 6F REP (CATHETERS) ×2 IMPLANT
PACK EP LATEX FREE (CUSTOM PROCEDURE TRAY) ×2
PACK EP LF (CUSTOM PROCEDURE TRAY) ×1 IMPLANT
PAD PRO RADIOLUCENT 2001M-C (PAD) ×2 IMPLANT
SHEATH PINNACLE 6F 10CM (SHEATH) ×2 IMPLANT
SHEATH PINNACLE 7F 10CM (SHEATH) ×1 IMPLANT
SHEATH PINNACLE 8F 10CM (SHEATH) ×2 IMPLANT
SHIELD RADPAD SCOOP 12X17 (MISCELLANEOUS) ×1 IMPLANT

## 2019-12-17 NOTE — H&P (Signed)
HPI Kathleen Daniels is referred today by Cecilie Kicks for evaluation of SVT.  She is a very pleasant 75 year old woman with a history of palpitations for over a year.  These episodes have increased in frequency and severity over the last 6 months.  She does not have much in the way of symptoms despite documented heart rates of 200 bpm.  She does note that the episodes start and stop suddenly.  She has very mild shortness of breath.  She denies chest pain or syncope.  She does experience palpitations.  The spells are not associated with activity or position.  She has never had to go to the emergency room.  Her SVT was documented on a cardiac monitor. Allergies  Allergen Reactions  . Penicillins Hives and Swelling    Facial swelling  . Tamiflu [Oseltamivir Phosphate]            Current Outpatient Medications  Medication Sig Dispense Refill  . ALPRAZolam (XANAX) 0.5 MG tablet Take 0.5 mg by mouth at bedtime.     Marland Kitchen amphetamine-dextroamphetamine (ADDERALL) 20 MG tablet Take 20 mg by mouth daily. Take 1/2 tablet 3 times a day.    . B Complex Vitamins (VITAMIN B-COMPLEX PO) Take 1 tablet by mouth daily.    Marland Kitchen buPROPion (WELLBUTRIN XL) 150 MG 24 hr tablet Take 150 mg by mouth 3 (three) times daily.     . Cholecalciferol (VITAMIN D-3) 1000 UNITS CAPS Take 1,000 Units by mouth daily.     . finasteride (PROSCAR) 5 MG tablet Take 5 mg by mouth daily.   1  . HYDROcodone-acetaminophen (NORCO/VICODIN) 5-325 MG tablet Take 1 tablet by mouth every 6 (six) hours as needed for moderate pain. 30 tablet 0  . hydroxychloroquine (PLAQUENIL) 200 MG tablet TK 1 T PO BID  4  . valACYclovir (VALTREX) 500 MG tablet Take 500 mg by mouth as needed. Use as directed  3            Current Facility-Administered Medications  Medication Dose Route Frequency Provider Last Rate Last Admin  . lidocaine (XYLOCAINE) 1 % (with pres) injection 3 mL  3 mL Infiltration Once Hilts, Michael, MD      .  methylPREDNISolone acetate (DEPO-MEDROL) injection 40 mg  40 mg Intra-articular Once Hilts, Legrand Como, MD             Past Medical History:  Diagnosis Date  . ADHD   . Alopecia   . Arthritis   . Depression     ROS:   All systems reviewed and negative except as noted in the HPI.        Past Surgical History:  Procedure Laterality Date  . ABDOMINAL HYSTERECTOMY    . BREAST ENHANCEMENT SURGERY            Family History  Problem Relation Age of Onset  . Colon cancer Neg Hx   . Pancreatic cancer Neg Hx   . Stomach cancer Neg Hx      Social History        Socioeconomic History  . Marital status: Divorced    Spouse name: Not on file  . Number of children: Not on file  . Years of education: Not on file  . Highest education level: Not on file  Occupational History  . Not on file  Tobacco Use  . Smoking status: Never Smoker  . Smokeless tobacco: Never Used  Substance and Sexual Activity  . Alcohol use: No  .  Drug use: No  . Sexual activity: Not on file  Other Topics Concern  . Not on file  Social History Narrative  . Not on file   Social Determinants of Health      Financial Resource Strain:   . Difficulty of Paying Living Expenses: Not on file  Food Insecurity:   . Worried About Charity fundraiser in the Last Year: Not on file  . Ran Out of Food in the Last Year: Not on file  Transportation Needs:   . Lack of Transportation (Medical): Not on file  . Lack of Transportation (Non-Medical): Not on file  Physical Activity:   . Days of Exercise per Week: Not on file  . Minutes of Exercise per Session: Not on file  Stress:   . Feeling of Stress : Not on file  Social Connections:   . Frequency of Communication with Friends and Family: Not on file  . Frequency of Social Gatherings with Friends and Family: Not on file  . Attends Religious Services: Not on file  . Active Member of Clubs or Organizations: Not on file  . Attends  Archivist Meetings: Not on file  . Marital Status: Not on file  Intimate Partner Violence:   . Fear of Current or Ex-Partner: Not on file  . Emotionally Abused: Not on file  . Physically Abused: Not on file  . Sexually Abused: Not on file     BP 116/74   Pulse 60   Ht 5' 0.5" (1.537 m)   Wt 137 lb 12.8 oz (62.5 kg)   SpO2 97%   BMI 26.47 kg/m   Physical Exam:  Well appearing 74 year old woman, NAD HEENT: Unremarkable Neck: 6 cm JVD, no thyromegally Lymphatics:  No adenopathy Back:  No CVA tenderness Lungs:  Clear, with no wheezes, rales, or rhonchi HEART:  Regular rate rhythm, no murmurs, no rubs, no clicks Abd:  soft, positive bowel sounds, no organomegally, no rebound, no guarding Ext:  2 plus pulses, no edema, no cyanosis, no clubbing Skin:  No rashes no nodules Neuro:  CN II through XII intact, motor grossly intact  EKG -normal sinus rhythm with no ventricular preexcitation   Assess/Plan: 1.  SVT -the patient has had SVT at 200 bpm.  She is not particularly interested in medical therapy as she has low blood pressure and has experienced beta-blockers in the past.  I have discussed the treatment options in detail.  The risks, goals, benefits, and expectations of catheter ablation were reviewed.  She will call us if she would like to proceed with ablation.  Cristopher Peru, MD  EP attending  Patient seen and examined.  Agree with the findings as noted above.  Since her last clinic visit, no significant change.  I have reviewed the procedure in detail with the patient today.  She wishes to proceed with EP study and catheter ablation.  Cristopher Peru, MD

## 2019-12-17 NOTE — Progress Notes (Signed)
Patient was given discharge instructions. She verbalized understanding. 

## 2019-12-17 NOTE — Discharge Instructions (Signed)
Post procedure care instructions No driving for 4 days. No lifting over 5 lbs for 1 week. No vigorous or sexual activity for 1 week. You may return to work/your usual activities on 12/24/2019. Keep procedure site clean & dry. If you notice increased pain, swelling, bleeding or pus, call/return!  You may shower, but no soaking baths/hot tubs/pools for 1 week.     Cardiac Ablation, Care After  This sheet gives you information about how to care for yourself after your procedure. Your health care provider may also give you more specific instructions. If you have problems or questions, contact your health care provider. What can I expect after the procedure? After the procedure, it is common to have:  Bruising around your puncture site.  Tenderness around your puncture site.  Skipped heartbeats.  Tiredness (fatigue).  Follow these instructions at home: Puncture site care   Follow instructions from your health care provider about how to take care of your puncture site. Make sure you: ? If present, leave stitches (sutures), skin glue, or adhesive strips in place. These skin closures may need to stay in place for up to 2 weeks. If adhesive strip edges start to loosen and curl up, you may trim the loose edges. Do not remove adhesive strips completely unless your health care provider tells you to do that.  Check your puncture site every day for signs of infection. Check for: ? Redness, swelling, or pain. ? Fluid or blood. If your puncture site starts to bleed, lie down on your back, apply firm pressure to the area, and contact your health care provider. ? Warmth. ? Pus or a bad smell. Driving  Do not drive for at least 4 days after your procedure or however long your health care provider recommends. (Do not resume driving if you have previously been instructed not to drive for other health reasons.)  Do not drive or use heavy machinery while taking prescription pain  medicine. Activity  Avoid activities that take a lot of effort for at least 7 days after your procedure.  Do not lift anything that is heavier than 5 lb (4.5 kg) for one week.   No sexual activity for 1 week.   Return to your normal activities as told by your health care provider. Ask your health care provider what activities are safe for you. General instructions  Take over-the-counter and prescription medicines only as told by your health care provider.  Do not use any products that contain nicotine or tobacco, such as cigarettes and e-cigarettes. If you need help quitting, ask your health care provider.  You may shower after 24 hours, but Do not take baths, swim, or use a hot tub for 1 week.   Do not drink alcohol for 24 hours after your procedure.  Keep all follow-up visits as told by your health care provider. This is important. Contact a health care provider if:  You have redness, mild swelling, or pain around your puncture site.  You have fluid or blood coming from your puncture site that stops after applying firm pressure to the area.  Your puncture site feels warm to the touch.  You have pus or a bad smell coming from your puncture site.  You have a fever.  You have chest pain or discomfort that spreads to your neck, jaw, or arm.  You are sweating a lot.  You feel nauseous.  You have a fast or irregular heartbeat.  You have shortness of breath.  You are dizzy  or light-headed and feel the need to lie down.  You have pain or numbness in the arm or leg closest to your puncture site. Get help right away if:  Your puncture site suddenly swells.  Your puncture site is bleeding and the bleeding does not stop after applying firm pressure to the area. These symptoms may represent a serious problem that is an emergency. Do not wait to see if the symptoms will go away. Get medical help right away. Call your local emergency services (911 in the [place] Do not drive  yourself to the hospital. Summary  After the procedure, it is normal to have bruising and tenderness at the puncture site in your groin, neck, or forearm.  Check your puncture site every day for signs of infection.  Get help right away if your puncture site is bleeding and the bleeding does not stop after applying firm pressure to the area. This is a medical emergency. This information is not intended to replace advice given to you by your health care provider. Make sure you discuss any questions you have with your health care provider.

## 2019-12-17 NOTE — Progress Notes (Signed)
Site area: rt ij venous sheath Site Prior to Removal:  Level 0 Pressure Applied For: 10 minutes Manual:   yes Patient Status During Pull:  stable Post Pull Site:  Level 0 Post Pull Instructions Given:  yes Post Pull Pulses Present: NA Dressing Applied:  Petroleum gauze, gauze and tegaderm Bedrest begins @  Comments:   

## 2019-12-17 NOTE — Progress Notes (Signed)
Site area: rt groin fa and 3 rt fv sheaths Site Prior to Removal:  Level 0 Pressure Applied For: 30 minutes Manual:   yes Patient Status During Pull:  stable Post Pull Site:  Level 0 Post Pull Instructions Given:  yes Post Pull Pulses Present: rt dp palpable Dressing Applied:  Gauze and tegaderm Bedrest begins @ 1500 Comments: IV saline locked

## 2019-12-18 ENCOUNTER — Encounter (HOSPITAL_COMMUNITY): Payer: Self-pay | Admitting: Internal Medicine

## 2019-12-18 MED FILL — Bupivacaine HCl Preservative Free (PF) Inj 0.25%: INTRAMUSCULAR | Qty: 30 | Status: AC

## 2019-12-18 MED FILL — Heparin Sod (Porcine)-NaCl IV Soln 1000 Unit/500ML-0.9%: INTRAVENOUS | Qty: 500 | Status: AC

## 2019-12-24 DIAGNOSIS — Z0120 Encounter for dental examination and cleaning without abnormal findings: Secondary | ICD-10-CM | POA: Diagnosis not present

## 2019-12-25 ENCOUNTER — Other Ambulatory Visit: Payer: Self-pay

## 2019-12-25 ENCOUNTER — Encounter: Payer: Self-pay | Admitting: Internal Medicine

## 2019-12-25 ENCOUNTER — Ambulatory Visit: Payer: Medicare Other | Admitting: Internal Medicine

## 2019-12-25 VITALS — BP 142/70 | HR 88 | Ht 60.5 in | Wt 140.0 lb

## 2019-12-25 DIAGNOSIS — I729 Aneurysm of unspecified site: Secondary | ICD-10-CM

## 2019-12-25 DIAGNOSIS — T81718A Complication of other artery following a procedure, not elsewhere classified, initial encounter: Secondary | ICD-10-CM

## 2019-12-25 NOTE — Progress Notes (Signed)
HPI Ms. Mynhier returns today for followup. She is a pleasant 74 yo woman with longstanding SVT s/p EPS/RFA of a far left lateral AP over a week ago. She had done well but developed a large bruise and returns today for evaluation. She has not had sustained palpitations. Her leg is sore but does not hurt to walk.  Allergies  Allergen Reactions  . Penicillins Hives and Swelling    Facial swelling  . Tamiflu [Oseltamivir Phosphate] Other (See Comments)    AMS/high fever     Current Outpatient Medications  Medication Sig Dispense Refill  . ALPRAZolam (XANAX) 0.5 MG tablet Take 0.5 mg by mouth at bedtime.     Marland Kitchen amphetamine-dextroamphetamine (ADDERALL) 20 MG tablet Take 10 mg by mouth in the morning, at noon, and at bedtime.     . B Complex Vitamins (VITAMIN B-COMPLEX PO) Take 1 tablet by mouth daily.    Marland Kitchen buPROPion (WELLBUTRIN XL) 150 MG 24 hr tablet Take 150 mg by mouth 3 (three) times daily.     . Calcium Carbonate-Vitamin D (CALCIUM PLUS VITAMIN D) 300-100 MG-UNIT CAPS Take 1 tablet by mouth in the morning and at bedtime.    . Cholecalciferol (VITAMIN D-3) 1000 UNITS CAPS Take 1,000 Units by mouth daily.     . finasteride (PROSCAR) 5 MG tablet Take 2.5 mg by mouth daily.   1  . hydroxychloroquine (PLAQUENIL) 200 MG tablet Take 200 mg by mouth in the morning and at bedtime.   4  . naproxen sodium (ALEVE) 220 MG tablet Take 440 mg by mouth daily in the afternoon.    . valACYclovir (VALTREX) 500 MG tablet Take 500 mg by mouth daily as needed (at onset of outbreaks (fever blisters)).   3   Current Facility-Administered Medications  Medication Dose Route Frequency Provider Last Rate Last Admin  . lidocaine (XYLOCAINE) 1 % (with pres) injection 3 mL  3 mL Infiltration Once Hilts, Michael, MD      . methylPREDNISolone acetate (DEPO-MEDROL) injection 40 mg  40 mg Intra-articular Once Hilts, Legrand Como, MD         Past Medical History:  Diagnosis Date  . ADHD   . Alopecia   . Arthritis    . Depression     ROS:   All systems reviewed and negative except as noted in the HPI.   Past Surgical History:  Procedure Laterality Date  . ABDOMINAL HYSTERECTOMY    . BREAST ENHANCEMENT SURGERY    . SVT ABLATION N/A 12/17/2019   Procedure: SVT ABLATION;  Surgeon: Evans Lance, MD;  Location: [facility];  Service: Cardiovascular;  Laterality: N/A;     Family History  Problem Relation Age of Onset  . Colon cancer Neg Hx   . Pancreatic cancer Neg Hx   . Stomach cancer Neg Hx      Social History   Socioeconomic History  . Marital status: Divorced    Spouse name: Not on file  . Number of children: Not on file  . Years of education: Not on file  . Highest education level: Not on file  Occupational History  . Not on file  Tobacco Use  . Smoking status: Never Smoker  . Smokeless tobacco: Never Used  Substance and Sexual Activity  . Alcohol use: No  . Drug use: No  . Sexual activity: Not on file  Other Topics Concern  . Not on file  Social History Narrative  . Not on file  Social Determinants of Health   Financial Resource Strain:   . Difficulty of Paying Living Expenses: Not on file  Food Insecurity:   . Worried About Charity fundraiser in the Last Year: Not on file  . Ran Out of Food in the Last Year: Not on file  Transportation Needs:   . Lack of Transportation (Medical): Not on file  . Lack of Transportation (Non-Medical): Not on file  Physical Activity:   . Days of Exercise per Week: Not on file  . Minutes of Exercise per Session: Not on file  Stress:   . Feeling of Stress : Not on file  Social Connections:   . Frequency of Communication with Friends and Family: Not on file  . Frequency of Social Gatherings with Friends and Family: Not on file  . Attends Religious Services: Not on file  . Active Member of Clubs or Organizations: Not on file  . Attends Archivist Meetings: Not on file  . Marital Status: Not on file  Intimate  Partner Violence:   . Fear of Current or Ex-Partner: Not on file  . Emotionally Abused: Not on file  . Physically Abused: Not on file  . Sexually Abused: Not on file     BP (!) 142/70   Pulse 88   Ht 5' 0.5" (1.537 m)   Wt 140 lb (63.5 kg)   SpO2 96%   BMI 26.89 kg/m   Physical Exam:  Well appearing NAD HEENT: Unremarkable Neck:  No JVD, no thyromegally Lymphatics:  No adenopathy Back:  No CVA tenderness Lungs:  Clear HEART:  Regular rate rhythm, no murmurs, no rubs, no clicks Abd:  soft, positive bowel sounds, no organomegally, no rebound, no guarding Ext:  2 plus pulses, no edema, no cyanosis, no clubbing, large area of ecchymosis in the right groin. Soft bruit is present. Skin:  No rashes no nodules Neuro:  CN II through XII intact, motor grossly intact   Assess/Plan: 1. Groin hematoma - she will undergo doppler of the right leg to rule out pseudoaneurysm or fistula. Additonal treatment will depend on the results. 2. SVT - no recurrent symptoms. She will undergo watchful waiting. 3. HTN - her bp is a bit elevated. She will hold of on medical therapy for now.  Carleene Overlie Nyja Westbrook,MD

## 2019-12-25 NOTE — Patient Instructions (Signed)
Medication Instructions:  Your physician recommends that you continue on your current medications as directed. Please refer to the Current Medication list given to you today.  Labwork: None ordered.  Testing/Procedures:  Please schedule for ultrasound of right lower extremity/groin area to rule out pseudoaneurysm.  Follow-Up:  Based on results of ultrasound.  Any Other Special Instructions Will Be Listed Below (If Applicable).  If you need a refill on your cardiac medications before your next appointment, please call your pharmacy.

## 2019-12-26 ENCOUNTER — Ambulatory Visit (HOSPITAL_COMMUNITY)
Admission: RE | Admit: 2019-12-26 | Discharge: 2019-12-26 | Disposition: A | Discharge status: Home / Self Care | Payer: Medicare Other | Source: Ambulatory Visit | Attending: Cardiovascular Disease | Admitting: Cardiovascular Disease

## 2019-12-26 DIAGNOSIS — I729 Aneurysm of unspecified site: Secondary | ICD-10-CM

## 2019-12-26 DIAGNOSIS — T81718A Complication of other artery following a procedure, not elsewhere classified, initial encounter: Secondary | ICD-10-CM

## 2019-12-27 ENCOUNTER — Inpatient Hospital Stay (HOSPITAL_COMMUNITY): Admission: RE | Admit: 2019-12-27 | Payer: Medicare Other | Source: Ambulatory Visit

## 2020-01-07 DIAGNOSIS — H00012 Hordeolum externum right lower eyelid: Secondary | ICD-10-CM | POA: Diagnosis not present

## 2020-01-15 ENCOUNTER — Encounter: Payer: Self-pay | Admitting: Internal Medicine

## 2020-01-15 ENCOUNTER — Ambulatory Visit: Payer: Medicare Other | Admitting: Internal Medicine

## 2020-01-15 ENCOUNTER — Other Ambulatory Visit: Payer: Self-pay

## 2020-01-15 VITALS — BP 132/74 | HR 64 | Ht 60.5 in | Wt 135.2 lb

## 2020-01-15 DIAGNOSIS — I471 Supraventricular tachycardia: Secondary | ICD-10-CM | POA: Diagnosis not present

## 2020-01-15 DIAGNOSIS — L814 Other melanin hyperpigmentation: Secondary | ICD-10-CM | POA: Diagnosis not present

## 2020-01-15 DIAGNOSIS — D1801 Hemangioma of skin and subcutaneous tissue: Secondary | ICD-10-CM | POA: Diagnosis not present

## 2020-01-15 DIAGNOSIS — L661 Lichen planopilaris: Secondary | ICD-10-CM | POA: Diagnosis not present

## 2020-01-15 DIAGNOSIS — L821 Other seborrheic keratosis: Secondary | ICD-10-CM | POA: Diagnosis not present

## 2020-01-15 NOTE — Progress Notes (Signed)
HPI Kathleen Daniels returns today for followup. 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 She developed a fairly large area of ecchymosis but u/s did not suggest a fistula or pseudoaneurysm. She has healed. She feels well and has not had more SVT. Her energy level has been slow to improve. Allergies  Allergen Reactions  . Penicillins Hives and Swelling    Facial swelling  . Tamiflu [Oseltamivir Phosphate] Other (See Comments)    AMS/high fever     Current Outpatient Medications  Medication Sig Dispense Refill  . ALPRAZolam (XANAX) 0.5 MG tablet Take 0.5 mg by mouth at bedtime.     Marland Kitchen amphetamine-dextroamphetamine (ADDERALL) 20 MG tablet Take 10 mg by mouth in the morning, at noon, and at bedtime.     . B Complex Vitamins (VITAMIN B-COMPLEX PO) Take 1 tablet by mouth daily.    Marland Kitchen buPROPion (WELLBUTRIN XL) 150 MG 24 hr tablet Take 150 mg by mouth 3 (three) times daily.     . Calcium Carbonate-Vitamin D (CALCIUM PLUS VITAMIN D) 300-100 MG-UNIT CAPS Take 1 tablet by mouth in the morning and at bedtime.    . Cholecalciferol (VITAMIN D-3) 1000 UNITS CAPS Take 1,000 Units by mouth daily.     . finasteride (PROSCAR) 5 MG tablet Take 2.5 mg by mouth daily.   1  . hydroxychloroquine (PLAQUENIL) 200 MG tablet Take 200 mg by mouth in the morning and at bedtime.   4  . naproxen sodium (ALEVE) 220 MG tablet Take 440 mg by mouth daily in the afternoon.    . valACYclovir (VALTREX) 500 MG tablet Take 500 mg by mouth daily as needed (at onset of outbreaks (fever blisters)).   3   Current Facility-Administered Medications  Medication Dose Route Frequency Provider Last Rate Last Admin  . lidocaine (XYLOCAINE) 1 % (with pres) injection 3 mL  3 mL Infiltration Once Hilts, Michael, MD      . methylPREDNISolone acetate (DEPO-MEDROL) injection 40 mg  40 mg Intra-articular Once Hilts, Legrand Como, MD         Past Medical History:   Diagnosis Date  . ADHD   . Alopecia   . Arthritis   . Depression     ROS:   All systems reviewed and negative except as noted in the HPI.   Past Surgical History:  Procedure Laterality Date  . ABDOMINAL HYSTERECTOMY    . BREAST ENHANCEMENT SURGERY    . SVT ABLATION N/A 12/17/2019   Procedure: SVT ABLATION;  Surgeon: Evans Lance, MD;  Location: [facility];  Service: Cardiovascular;  Laterality: N/A;     Family History  Problem Relation Age of Onset  . Colon cancer Neg Hx   . Pancreatic cancer Neg Hx   . Stomach cancer Neg Hx      Social History   Socioeconomic History  . Marital status: Divorced    Spouse name: Not on file  . Number of children: Not on file  . Years of education: Not on file  . Highest education level: Not on file  Occupational History  . Not on file  Tobacco Use  . Smoking status: Never Smoker  . Smokeless tobacco: Never Used  Substance and Sexual Activity  . Alcohol use: No  . Drug use: No  . Sexual activity: Not on file  Other Topics Concern  . Not on file  Social  History Narrative  . Not on file   Social Determinants of Health   Financial Resource Strain:   . Difficulty of Paying Living Expenses: Not on file  Food Insecurity:   . Worried About Charity fundraiser in the Last Year: Not on file  . Ran Out of Food in the Last Year: Not on file  Transportation Needs:   . Lack of Transportation (Medical): Not on file  . Lack of Transportation (Non-Medical): Not on file  Physical Activity:   . Days of Exercise per Week: Not on file  . Minutes of Exercise per Session: Not on file  Stress:   . Feeling of Stress : Not on file  Social Connections:   . Frequency of Communication with Friends and Family: Not on file  . Frequency of Social Gatherings with Friends and Family: Not on file  . Attends Religious Services: Not on file  . Active Member of Clubs or Organizations: Not on file  . Attends Archivist Meetings:  Not on file  . Marital Status: Not on file  Intimate Partner Violence:   . Fear of Current or Ex-Partner: Not on file  . Emotionally Abused: Not on file  . Physically Abused: Not on file  . Sexually Abused: Not on file     BP 132/74   Pulse 64   Ht 5' 0.5" (1.537 m)   Wt 135 lb 3.2 oz (61.3 kg)   SpO2 94%   BMI 25.97 kg/m   Physical Exam:  Well appearing 74 yo woman, NAD HEENT: Unremarkable Neck:  No JVD, no thyromegally Lymphatics:  No adenopathy Back:  No CVA tenderness Lungs:  Clear with no wheezes HEART:  Regular rate rhythm, no murmurs, no rubs, no clicks Abd:  soft, positive bowel sounds, no organomegally, no rebound, no guarding Ext:  2 plus pulses, no edema, no cyanosis, no clubbing Skin:  No rashes no nodules Neuro:  CN II through XII intact, motor grossly intact  EKG - NSR   Assess/Plan: 1. SVT - she is doing well after EP study and catheter ablation. She will undergo watchful waiting. 2. HTN - her bp is controlled. No change in meds.  Kathleen Overlie Brack Shaddock,MD

## 2020-01-15 NOTE — Patient Instructions (Addendum)
Medication Instructions:  Your physician recommends that you continue on your current medications as directed. Please refer to the Current Medication list given to you today.  Labwork: None ordered.  Testing/Procedures: None ordered.  Follow-Up: Your physician wants you to follow-up in: as needed with Dr. Taylor.      Any Other Special Instructions Will Be Listed Below (If Applicable).  If you need a refill on your cardiac medications before your next appointment, please call your pharmacy.   

## 2020-01-16 NOTE — Addendum Note (Signed)
Addended by: Rose Phi on: 01/16/2020 01:14 PM   Modules accepted: Orders

## 2020-01-18 DIAGNOSIS — Z1231 Encounter for screening mammogram for malignant neoplasm of breast: Secondary | ICD-10-CM | POA: Diagnosis not present

## 2020-01-22 DIAGNOSIS — H0012 Chalazion right lower eyelid: Secondary | ICD-10-CM | POA: Diagnosis not present

## 2020-01-25 ENCOUNTER — Ambulatory Visit: Payer: Self-pay

## 2020-01-25 ENCOUNTER — Ambulatory Visit: Payer: Medicare Other | Admitting: Family Medicine

## 2020-01-25 ENCOUNTER — Other Ambulatory Visit: Payer: Self-pay

## 2020-01-25 DIAGNOSIS — M25512 Pain in left shoulder: Secondary | ICD-10-CM

## 2020-01-25 DIAGNOSIS — G8929 Other chronic pain: Secondary | ICD-10-CM | POA: Diagnosis not present

## 2020-01-25 NOTE — Progress Notes (Signed)
Office Visit Note   Patient: Kathleen Daniels           Date of Birth: 12/15/1945           MRN: [id number] Visit Date: 01/25/2020 Requested by: Jessi Jessop Boston, MD [place],  [place] [address] PCP: [place] Zou Boston, MD  Subjective: Chief Complaint  Patient presents with  . Left Shoulder - Pain    ROM has decreased. Has started waking her at night. Has slowly progressed. Last injected with cortisone in 04/2017.    HPI: She is here with left shoulder pain.  Original onset in 2019 after she was fired from her job.  She thinks all of the stress led to her pain.  She went to [facility] and was given a cortisone injection which helped until just recently.  About a week ago she received the COVID-19 booster in her right arm and her left shoulder has been hurting since then with decreased range of motion.  The pain really started even before that, but worse this past week.              ROS:   All other systems were reviewed and are negative.  Objective: Vital Signs: There were no vitals taken for this visit.  Physical Exam:  General:  Alert and oriented, in no acute distress. Pulm:  Breathing unlabored. Psy:  Normal mood, congruent affect.  Left shoulder: She has significantly decreased range of motion with abduction of 85 degrees, external rotation about neutral, and internal rotation of about 60 degrees.  She has pain at the extremes.  Rotator cuff strength is still 5/5.  There is palpable crepitus with passive range of motion.    Imaging: US Guided Needle Placement  Result Date: 01/25/2020 Ultrasound guided injection is preferred based studies that show increased duration, increased effect, greater accuracy, decreased procedural pain, increased response rate, and decreased cost with ultrasound guided versus blind injection.   Verbal informed consent obtained.  Time-out conducted.  Noted no overlying erythema, induration, or other signs of local infection.  Ultrasound-guided left glenohumeral injection: After sterile prep with Betadine, injected 8 cc 1% lidocaine without epinephrine and 40 mg methylprednisolone using a 22-gauge spinal needle, passing the needle from posterior approach into the glenohumeral joint.  Injectate seen filling joint capsule.  Good immediate relief.     Assessment & Plan: 1.  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 -Discussed options with her and she wants to try an injection again since it worked well before.  We will do ultrasound-guided glenohumeral injection.  She will follow-up as needed.     Procedures: No procedures performed  No notes on file     PMFS History: Patient Active Problem List   Diagnosis Date Noted  . SVT (supraventricular tachycardia) ([facility]) 11/16/2019  . Mucoid cyst of joint 09/02/2016  . Pain in finger of right hand 09/02/2016  . Traumatic partial tear of right biceps tendon 05/17/2014  . Depression 05/17/2014  . ADD (attention deficit disorder) 05/17/2014  . Right elbow pain 05/17/2014   Past Medical History:  Diagnosis Date  . ADHD   . Alopecia   . Arthritis   . Depression     Family History  Problem Relation Age of Onset  . Colon cancer Neg Hx   . Pancreatic cancer Neg Hx   . Stomach cancer Neg Hx     Past Surgical History:  Procedure Laterality Date  .  ABDOMINAL HYSTERECTOMY    . BREAST ENHANCEMENT SURGERY    . SVT ABLATION N/A 12/17/2019   Procedure: SVT ABLATION;  Surgeon: Evans Lance, MD;  Location: [facility];  Service: Cardiovascular;  Laterality: N/A;   Social History   Occupational History  . Not on file  Tobacco Use  . Smoking status: Never Smoker  . Smokeless tobacco: Never Used  Substance and Sexual Activity  . Alcohol use: No  . Drug use: No  . Sexual activity: Not on file

## 2020-04-28 ENCOUNTER — Telehealth: Payer: Self-pay

## 2020-04-28 NOTE — Telephone Encounter (Signed)
   [facility] Pre-operative Risk Assessment    HEARTCARE STAFF: - Please ensure there is not already an duplicate clearance open for this procedure. - Under Visit Info/Reason for Call, type in Other and utilize the format Clearance MM/DD/YY or Clearance TBD. Do not use dashes or single digits. - If request is for dental extraction, please clarify the # of teeth to be extracted.  Request for surgical clearance:  1. What type of surgery is being performed? BREAST IMPLANTS   2. When is this surgery scheduled? TBD   3. What type of clearance is required (medical clearance vs. Pharmacy clearance to hold med vs. Both)? MEDICAL  4. Are there any medications that need to be held prior to surgery and how long? NONE   5. Practice name and name of physician performing surgery? [facility]    6. What is the office phone number? [phone]   7.   What is the office fax number? [phone]  8.   Anesthesia type (None, local, MAC, general) ? NONE LISTED   Kathleen Daniels 04/28/2020, 5:16 PM  _________________________________________________________________   (provider comments below)

## 2020-04-29 DIAGNOSIS — Z01812 Encounter for preprocedural laboratory examination: Secondary | ICD-10-CM | POA: Diagnosis not present

## 2020-04-29 DIAGNOSIS — I471 Supraventricular tachycardia: Secondary | ICD-10-CM | POA: Diagnosis not present

## 2020-04-29 DIAGNOSIS — Z79899 Other long term (current) drug therapy: Secondary | ICD-10-CM | POA: Diagnosis not present

## 2020-04-29 NOTE — Telephone Encounter (Signed)
   Primary Electophysiologist: Dr. Crissie Sickles  Chart reviewed as part of pre-operative protocol coverage. Patient was contacted 04/29/2020 in reference to pre-operative risk assessment for pending surgery as outlined below.  Kathleen Daniels was last seen on 01/15/20 by Dr. Lovena Le.  Left Vm requesting call back 04/29/20 at 0925 and sent MyChart message.  Loel Dubonnet, NP 04/29/2020, 9:20 AM

## 2020-04-30 NOTE — Telephone Encounter (Signed)
   Primary Cardiologist: Dr Lovena Le  Chart reviewed and patient contacted by phone today  as part of pre-operative protocol coverage. Given past medical history and time since last visit, based on ACC/AHA guidelines, Shital A Mullany would be at acceptable risk for the planned procedure without further cardiovascular testing.   The patient was advised that if she develops new symptoms prior to surgery to contact our office to arrange for a follow-up visit, and she verbalized understanding.  I will route this recommendation to the requesting party via Epic fax function and remove from pre-op pool.  Please call with questions.  Kerin Ransom, PA-C 04/30/2020, 8:31 AM

## 2020-05-05 NOTE — Telephone Encounter (Signed)
Note faxed to surgeon by Kerin Ransom, PA-C last week.  I will fax again. I am not able to fax the EKG from my end. Can someone in the office print her last EKG with Dr. Lovena Le and physically fax it to the provided number? Richardson Dopp, PA-C    05/05/2020 2:46 PM

## 2020-05-05 NOTE — Telephone Encounter (Signed)
Will have Med Rec Dept fax EKG to surgeon's office for compliance.

## 2020-05-05 NOTE — Telephone Encounter (Signed)
EKG has been faxed to Dr. Mechele Collin office

## 2020-05-05 NOTE — Telephone Encounter (Signed)
Notes faxed to surgeon. This phone note will be removed from the preop pool. Richardson Dopp, PA-C  05/05/2020 2:50 PM

## 2020-05-07 DIAGNOSIS — H2513 Age-related nuclear cataract, bilateral: Secondary | ICD-10-CM | POA: Diagnosis not present

## 2020-05-07 DIAGNOSIS — H353131 Nonexudative age-related macular degeneration, bilateral, early dry stage: Secondary | ICD-10-CM | POA: Diagnosis not present

## 2020-06-18 DIAGNOSIS — Z79899 Other long term (current) drug therapy: Secondary | ICD-10-CM | POA: Diagnosis not present

## 2020-06-18 DIAGNOSIS — M8588 Other specified disorders of bone density and structure, other site: Secondary | ICD-10-CM | POA: Diagnosis not present

## 2020-06-18 DIAGNOSIS — R059 Cough, unspecified: Secondary | ICD-10-CM | POA: Diagnosis not present

## 2020-06-18 DIAGNOSIS — J040 Acute laryngitis: Secondary | ICD-10-CM | POA: Diagnosis not present

## 2020-06-18 DIAGNOSIS — L659 Nonscarring hair loss, unspecified: Secondary | ICD-10-CM | POA: Diagnosis not present

## 2020-06-18 DIAGNOSIS — F325 Major depressive disorder, single episode, in full remission: Secondary | ICD-10-CM | POA: Diagnosis not present

## 2020-06-18 DIAGNOSIS — J0110 Acute frontal sinusitis, unspecified: Secondary | ICD-10-CM | POA: Diagnosis not present

## 2020-06-18 DIAGNOSIS — Z1152 Encounter for screening for COVID-19: Secondary | ICD-10-CM | POA: Diagnosis not present

## 2020-10-07 DIAGNOSIS — F331 Major depressive disorder, recurrent, moderate: Secondary | ICD-10-CM | POA: Diagnosis not present

## 2020-11-13 DIAGNOSIS — F331 Major depressive disorder, recurrent, moderate: Secondary | ICD-10-CM | POA: Diagnosis not present

## 2020-12-17 ENCOUNTER — Emergency Department (HOSPITAL_COMMUNITY): Payer: Medicare Other

## 2020-12-17 ENCOUNTER — Encounter (HOSPITAL_COMMUNITY): Payer: Self-pay | Admitting: Surgery

## 2020-12-17 ENCOUNTER — Inpatient Hospital Stay (HOSPITAL_COMMUNITY)
Admission: EM | Admit: 2020-12-17 | Discharge: 2020-12-25 | DRG: 535 | Disposition: A | Discharge status: Rehabilitation Facility | Payer: Medicare Other | Attending: Student | Admitting: Student

## 2020-12-17 DIAGNOSIS — Z880 Allergy status to penicillin: Secondary | ICD-10-CM | POA: Diagnosis not present

## 2020-12-17 DIAGNOSIS — M7989 Other specified soft tissue disorders: Secondary | ICD-10-CM | POA: Diagnosis not present

## 2020-12-17 DIAGNOSIS — M25421 Effusion, right elbow: Secondary | ICD-10-CM | POA: Diagnosis not present

## 2020-12-17 DIAGNOSIS — S50311D Abrasion of right elbow, subsequent encounter: Secondary | ICD-10-CM | POA: Diagnosis not present

## 2020-12-17 DIAGNOSIS — R1084 Generalized abdominal pain: Secondary | ICD-10-CM | POA: Diagnosis not present

## 2020-12-17 DIAGNOSIS — M19022 Primary osteoarthritis, left elbow: Secondary | ICD-10-CM | POA: Diagnosis not present

## 2020-12-17 DIAGNOSIS — S32401S Unspecified fracture of right acetabulum, sequela: Secondary | ICD-10-CM | POA: Diagnosis not present

## 2020-12-17 DIAGNOSIS — S301XXA Contusion of abdominal wall, initial encounter: Secondary | ICD-10-CM | POA: Diagnosis not present

## 2020-12-17 DIAGNOSIS — F32A Depression, unspecified: Secondary | ICD-10-CM | POA: Diagnosis not present

## 2020-12-17 DIAGNOSIS — G8911 Acute pain due to trauma: Secondary | ICD-10-CM | POA: Diagnosis not present

## 2020-12-17 DIAGNOSIS — L03114 Cellulitis of left upper limb: Secondary | ICD-10-CM | POA: Diagnosis not present

## 2020-12-17 DIAGNOSIS — S52691A Other fracture of lower end of right ulna, initial encounter for closed fracture: Secondary | ICD-10-CM | POA: Diagnosis present

## 2020-12-17 DIAGNOSIS — S52601D Unspecified fracture of lower end of right ulna, subsequent encounter for closed fracture with routine healing: Secondary | ICD-10-CM | POA: Diagnosis not present

## 2020-12-17 DIAGNOSIS — Z90710 Acquired absence of both cervix and uterus: Secondary | ICD-10-CM | POA: Diagnosis not present

## 2020-12-17 DIAGNOSIS — S32402D Unspecified fracture of left acetabulum, subsequent encounter for fracture with routine healing: Secondary | ICD-10-CM | POA: Diagnosis not present

## 2020-12-17 DIAGNOSIS — E8809 Other disorders of plasma-protein metabolism, not elsewhere classified: Secondary | ICD-10-CM | POA: Diagnosis not present

## 2020-12-17 DIAGNOSIS — S329XXA Fracture of unspecified parts of lumbosacral spine and pelvis, initial encounter for closed fracture: Secondary | ICD-10-CM | POA: Diagnosis present

## 2020-12-17 DIAGNOSIS — S52201A Unspecified fracture of shaft of right ulna, initial encounter for closed fracture: Secondary | ICD-10-CM | POA: Diagnosis not present

## 2020-12-17 DIAGNOSIS — R3915 Urgency of urination: Secondary | ICD-10-CM | POA: Diagnosis not present

## 2020-12-17 DIAGNOSIS — R52 Pain, unspecified: Secondary | ICD-10-CM

## 2020-12-17 DIAGNOSIS — D62 Acute posthemorrhagic anemia: Secondary | ICD-10-CM | POA: Diagnosis not present

## 2020-12-17 DIAGNOSIS — T1490XA Injury, unspecified, initial encounter: Secondary | ICD-10-CM | POA: Diagnosis not present

## 2020-12-17 DIAGNOSIS — E46 Unspecified protein-calorie malnutrition: Secondary | ICD-10-CM | POA: Diagnosis not present

## 2020-12-17 DIAGNOSIS — Z888 Allergy status to other drugs, medicaments and biological substances status: Secondary | ICD-10-CM

## 2020-12-17 DIAGNOSIS — S32302A Unspecified fracture of left ilium, initial encounter for closed fracture: Secondary | ICD-10-CM | POA: Diagnosis not present

## 2020-12-17 DIAGNOSIS — S52601A Unspecified fracture of lower end of right ulna, initial encounter for closed fracture: Secondary | ICD-10-CM | POA: Diagnosis not present

## 2020-12-17 DIAGNOSIS — S42401A Unspecified fracture of lower end of right humerus, initial encounter for closed fracture: Secondary | ICD-10-CM | POA: Diagnosis not present

## 2020-12-17 DIAGNOSIS — Y92410 Unspecified street and highway as the place of occurrence of the external cause: Secondary | ICD-10-CM | POA: Diagnosis not present

## 2020-12-17 DIAGNOSIS — N3289 Other specified disorders of bladder: Secondary | ICD-10-CM | POA: Diagnosis not present

## 2020-12-17 DIAGNOSIS — E871 Hypo-osmolality and hyponatremia: Secondary | ICD-10-CM | POA: Diagnosis not present

## 2020-12-17 DIAGNOSIS — Y9301 Activity, walking, marching and hiking: Secondary | ICD-10-CM | POA: Diagnosis present

## 2020-12-17 DIAGNOSIS — S32592A Other specified fracture of left pubis, initial encounter for closed fracture: Secondary | ICD-10-CM | POA: Diagnosis present

## 2020-12-17 DIAGNOSIS — G8929 Other chronic pain: Secondary | ICD-10-CM | POA: Diagnosis not present

## 2020-12-17 DIAGNOSIS — K5900 Constipation, unspecified: Secondary | ICD-10-CM | POA: Diagnosis not present

## 2020-12-17 DIAGNOSIS — S32409A Unspecified fracture of unspecified acetabulum, initial encounter for closed fracture: Secondary | ICD-10-CM

## 2020-12-17 DIAGNOSIS — F4311 Post-traumatic stress disorder, acute: Secondary | ICD-10-CM | POA: Diagnosis not present

## 2020-12-17 DIAGNOSIS — F419 Anxiety disorder, unspecified: Secondary | ICD-10-CM | POA: Diagnosis not present

## 2020-12-17 DIAGNOSIS — M25531 Pain in right wrist: Secondary | ICD-10-CM | POA: Diagnosis not present

## 2020-12-17 DIAGNOSIS — S32392A Other fracture of left ilium, initial encounter for closed fracture: Secondary | ICD-10-CM | POA: Diagnosis not present

## 2020-12-17 DIAGNOSIS — M19021 Primary osteoarthritis, right elbow: Secondary | ICD-10-CM | POA: Diagnosis not present

## 2020-12-17 DIAGNOSIS — M47816 Spondylosis without myelopathy or radiculopathy, lumbar region: Secondary | ICD-10-CM | POA: Diagnosis not present

## 2020-12-17 DIAGNOSIS — Z20822 Contact with and (suspected) exposure to covid-19: Secondary | ICD-10-CM | POA: Diagnosis present

## 2020-12-17 DIAGNOSIS — S32502A Unspecified fracture of left pubis, initial encounter for closed fracture: Secondary | ICD-10-CM | POA: Diagnosis not present

## 2020-12-17 DIAGNOSIS — S32512A Fracture of superior rim of left pubis, initial encounter for closed fracture: Principal | ICD-10-CM | POA: Diagnosis present

## 2020-12-17 DIAGNOSIS — J9811 Atelectasis: Secondary | ICD-10-CM | POA: Diagnosis not present

## 2020-12-17 DIAGNOSIS — S32402A Unspecified fracture of left acetabulum, initial encounter for closed fracture: Secondary | ICD-10-CM | POA: Diagnosis present

## 2020-12-17 DIAGNOSIS — K5903 Drug induced constipation: Secondary | ICD-10-CM | POA: Diagnosis not present

## 2020-12-17 DIAGNOSIS — Z041 Encounter for examination and observation following transport accident: Secondary | ICD-10-CM | POA: Diagnosis not present

## 2020-12-17 DIAGNOSIS — S3282XA Multiple fractures of pelvis without disruption of pelvic ring, initial encounter for closed fracture: Secondary | ICD-10-CM

## 2020-12-17 DIAGNOSIS — R079 Chest pain, unspecified: Secondary | ICD-10-CM | POA: Diagnosis not present

## 2020-12-17 DIAGNOSIS — R112 Nausea with vomiting, unspecified: Secondary | ICD-10-CM | POA: Diagnosis not present

## 2020-12-17 DIAGNOSIS — S52691D Other fracture of lower end of right ulna, subsequent encounter for closed fracture with routine healing: Secondary | ICD-10-CM | POA: Diagnosis not present

## 2020-12-17 HISTORY — DX: Depression, unspecified: F32.A

## 2020-12-17 HISTORY — DX: Other specified behavioral and emotional disorders with onset usually occurring in childhood and adolescence: F98.8

## 2020-12-17 HISTORY — DX: Supraventricular tachycardia, unspecified: I47.10

## 2020-12-17 HISTORY — DX: Supraventricular tachycardia: I47.1

## 2020-12-17 LAB — I-STAT CHEM 8, ED
BUN: 21 mg/dL (ref 8–23)
Calcium, Ion: 1.18 mmol/L (ref 1.15–1.40)
Chloride: 100 mmol/L (ref 98–111)
Creatinine, Ser: 0.9 mg/dL (ref 0.44–1.00)
Glucose, Bld: 98 mg/dL (ref 70–99)
HCT: 37 % (ref 36.0–46.0)
Hemoglobin: 12.6 g/dL (ref 12.0–15.0)
Potassium: 4.5 mmol/L (ref 3.5–5.1)
Sodium: 134 mmol/L — ABNORMAL LOW (ref 135–145)
TCO2: 25 mmol/L (ref 22–32)

## 2020-12-17 LAB — CBC
HCT: 36.6 % (ref 36.0–46.0)
Hemoglobin: 12.3 g/dL (ref 12.0–15.0)
MCH: 30.1 pg (ref 26.0–34.0)
MCHC: 33.6 g/dL (ref 30.0–36.0)
MCV: 89.7 fL (ref 80.0–100.0)
Platelets: 281 10*3/uL (ref 150–400)
RBC: 4.08 MIL/uL (ref 3.87–5.11)
RDW: 13.2 % (ref 11.5–15.5)
WBC: 5 10*3/uL (ref 4.0–10.5)
nRBC: 0 % (ref 0.0–0.2)

## 2020-12-17 LAB — PROTIME-INR
INR: 1 (ref 0.8–1.2)
Prothrombin Time: 12.8 seconds (ref 11.4–15.2)

## 2020-12-17 LAB — COMPREHENSIVE METABOLIC PANEL
ALT: 27 U/L (ref 0–44)
AST: 39 U/L (ref 15–41)
Albumin: 4.1 g/dL (ref 3.5–5.0)
Alkaline Phosphatase: 70 U/L (ref 38–126)
Anion gap: 7 (ref 5–15)
BUN: 20 mg/dL (ref 8–23)
CO2: 25 mmol/L (ref 22–32)
Calcium: 9.6 mg/dL (ref 8.9–10.3)
Chloride: 101 mmol/L (ref 98–111)
Creatinine, Ser: 0.94 mg/dL (ref 0.44–1.00)
GFR, Estimated: >60 mL/min (ref >60)
Glucose, Bld: 105 mg/dL — ABNORMAL HIGH (ref 70–99)
Potassium: 4.7 mmol/L (ref 3.5–5.1)
Sodium: 133 mmol/L — ABNORMAL LOW (ref 135–145)
Total Bilirubin: 0.8 mg/dL (ref 0.3–1.2)
Total Protein: 6.5 g/dL (ref 6.5–8.1)

## 2020-12-17 LAB — RESP PANEL BY RT-PCR (FLU A&B, COVID) ARPGX2
Influenza A by PCR: NEGATIVE
Influenza B by PCR: NEGATIVE
SARS Coronavirus 2 by RT PCR: NEGATIVE

## 2020-12-17 LAB — SAMPLE TO BLOOD BANK

## 2020-12-17 LAB — LACTIC ACID, PLASMA: Lactic Acid, Venous: 0.9 mmol/L (ref 0.5–1.9)

## 2020-12-17 LAB — ETHANOL: Alcohol, Ethyl (B): <10 mg/dL (ref <10)

## 2020-12-17 MED ORDER — KCL IN DEXTROSE-NACL 20-5-0.45 MEQ/L-%-% IV SOLN
INTRAVENOUS | Status: DC
  Filled 2020-12-17 (×10): qty 1000

## 2020-12-17 MED ORDER — ONDANSETRON HCL 4 MG PO TABS: 4.0000 mg | ORAL_TABLET | Freq: Four times a day (QID) | ORAL | Status: DC | PRN

## 2020-12-17 MED ORDER — ONDANSETRON HCL 4 MG/2ML IJ SOLN
4.0000 mg | Freq: Four times a day (QID) | INTRAMUSCULAR | Status: DC | PRN
  Administered 2020-12-18 – 2020-12-22 (×6): 4 mg via INTRAVENOUS
  Filled 2020-12-17 (×8): qty 2

## 2020-12-17 MED ORDER — SENNA 8.6 MG PO TABS
1.0000 | ORAL_TABLET | Freq: Two times a day (BID) | ORAL | Status: DC
  Administered 2020-12-17 – 2020-12-25 (×15): 8.6 mg via ORAL
  Filled 2020-12-17 (×16): qty 1

## 2020-12-17 MED ORDER — FENTANYL CITRATE (PF) 100 MCG/2ML IJ SOLN
INTRAMUSCULAR | Status: AC
  Filled 2020-12-17: qty 2

## 2020-12-17 MED ORDER — FLEET ENEMA 7-19 GM/118ML RE ENEM: 1.0000 | ENEMA | Freq: Once | RECTAL | Status: DC | PRN

## 2020-12-17 MED ORDER — HYDROCODONE-ACETAMINOPHEN 5-325 MG PO TABS
1.0000 | ORAL_TABLET | ORAL | Status: DC | PRN
  Administered 2020-12-17 – 2020-12-18 (×2): 2 via ORAL
  Filled 2020-12-17 (×2): qty 2

## 2020-12-17 MED ORDER — BISACODYL 10 MG RE SUPP
10.0000 mg | Freq: Every day | RECTAL | Status: DC | PRN
  Filled 2020-12-17: qty 1

## 2020-12-17 MED ORDER — IOHEXOL 350 MG/ML SOLN
75.0000 mL | Freq: Once | INTRAVENOUS | Status: AC | PRN
  Administered 2020-12-17: 75 mL via INTRAVENOUS

## 2020-12-17 MED ORDER — FENTANYL CITRATE PF 50 MCG/ML IJ SOSY
PREFILLED_SYRINGE | INTRAMUSCULAR | Status: AC | PRN
  Administered 2020-12-17: 100 ug via INTRAVENOUS

## 2020-12-17 MED ORDER — DOCUSATE SODIUM 100 MG PO CAPS
100.0000 mg | ORAL_CAPSULE | Freq: Two times a day (BID) | ORAL | Status: DC
  Administered 2020-12-17 – 2020-12-25 (×15): 100 mg via ORAL
  Filled 2020-12-17 (×16): qty 1

## 2020-12-17 MED ORDER — FENTANYL CITRATE PF 50 MCG/ML IJ SOSY
100.0000 ug | PREFILLED_SYRINGE | INTRAMUSCULAR | Status: DC | PRN
  Administered 2020-12-17 (×2): 100 ug via INTRAVENOUS
  Filled 2020-12-17: qty 2

## 2020-12-17 MED ORDER — POLYETHYLENE GLYCOL 3350 17 G PO PACK
17.0000 g | PACK | Freq: Every day | ORAL | Status: DC | PRN
  Administered 2020-12-20 – 2020-12-21 (×2): 17 g via ORAL
  Filled 2020-12-17 (×2): qty 1

## 2020-12-17 MED ORDER — MORPHINE SULFATE (PF) 2 MG/ML IV SOLN
0.5000 mg | INTRAVENOUS | Status: DC | PRN
  Administered 2020-12-18: 1 mg via INTRAVENOUS
  Administered 2020-12-18: 0.5 mg via INTRAVENOUS
  Administered 2020-12-18 (×5): 1 mg via INTRAVENOUS
  Filled 2020-12-17 (×7): qty 1

## 2020-12-17 MED ORDER — ACETAMINOPHEN 325 MG PO TABS: 325.0000 mg | ORAL_TABLET | Freq: Four times a day (QID) | ORAL | Status: DC | PRN
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
## 2020-12-17 NOTE — Progress Notes (Signed)
Orthopedic Tech Progress Note Patient Details:  Kathleen Daniels 05/25/1945 [id number]  Ortho Devices Type of Ortho Device: Sugartong splint Ortho Device/Splint Location: Right arm Ortho Device/Splint Interventions: Application   Post Interventions Patient Tolerated: Well  [name] 12/17/2020, 8:27 PM
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
## 2020-12-18 ENCOUNTER — Inpatient Hospital Stay (HOSPITAL_COMMUNITY): Payer: Medicare Other

## 2020-12-18 MED ORDER — BUPROPION HCL ER (XL) 150 MG PO TB24
150.0000 mg | ORAL_TABLET | Freq: Three times a day (TID) | ORAL | Status: DC
  Administered 2020-12-18 – 2020-12-25 (×22): 150 mg via ORAL
  Filled 2020-12-18 (×23): qty 1

## 2020-12-18 MED ORDER — WHITE PETROLATUM EX OINT
TOPICAL_OINTMENT | CUTANEOUS | Status: AC
  Filled 2020-12-18: qty 28.35

## 2020-12-18 MED ORDER — OXYCODONE HCL 5 MG PO TABS
10.0000 mg | ORAL_TABLET | ORAL | Status: DC | PRN
  Administered 2020-12-19 – 2020-12-25 (×8): 10 mg via ORAL
  Filled 2020-12-18 (×7): qty 2

## 2020-12-18 MED ORDER — OXYCODONE HCL 5 MG PO TABS
15.0000 mg | ORAL_TABLET | ORAL | Status: DC | PRN
  Administered 2020-12-19 – 2020-12-24 (×9): 15 mg via ORAL
  Filled 2020-12-18 (×12): qty 3

## 2020-12-18 MED ORDER — FINASTERIDE 1 MG PO TABS: 0.5000 mg | ORAL_TABLET | Freq: Every day | ORAL | Status: DC

## 2020-12-18 MED ORDER — FINASTERIDE 5 MG PO TABS: 5.0000 mg | ORAL_TABLET | Freq: Every day | ORAL | Status: DC

## 2020-12-18 MED ORDER — OXYCODONE HCL 5 MG PO TABS: 5.0000 mg | ORAL_TABLET | ORAL | Status: DC | PRN

## 2020-12-18 MED ORDER — POLYVINYL ALCOHOL 1.4 % OP SOLN
1.0000 [drp] | OPHTHALMIC | Status: DC | PRN
  Administered 2020-12-18 – 2020-12-19 (×2): 1 [drp] via OPHTHALMIC
  Filled 2020-12-18 (×2): qty 15

## 2020-12-18 MED ORDER — FINASTERIDE 5 MG PO TABS: 2.5000 mg | ORAL_TABLET | Freq: Every day | ORAL | Status: DC

## 2020-12-18 MED ORDER — ALPRAZOLAM 0.25 MG PO TABS
0.7500 mg | ORAL_TABLET | Freq: Every evening | ORAL | Status: DC | PRN
  Administered 2020-12-20 – 2020-12-24 (×5): 0.75 mg via ORAL
  Filled 2020-12-18 (×5): qty 3

## 2020-12-18 MED ORDER — ACETAMINOPHEN 325 MG PO TABS
325.0000 mg | ORAL_TABLET | Freq: Four times a day (QID) | ORAL | Status: DC | PRN
  Administered 2020-12-22: 650 mg via ORAL
  Filled 2020-12-18: qty 2

## 2020-12-18 MED ORDER — MORPHINE SULFATE (PF) 4 MG/ML IV SOLN
4.0000 mg | INTRAVENOUS | Status: DC | PRN
  Administered 2020-12-18 – 2020-12-21 (×8): 4 mg via INTRAVENOUS
  Administered 2020-12-21: 2 mg via INTRAVENOUS
  Administered 2020-12-22 (×2): 4 mg via INTRAVENOUS
  Filled 2020-12-18 (×12): qty 1

## 2020-12-18 MED ORDER — FINASTERIDE 2.5 MG HALF TABLET
2.5000 mg | ORAL_TABLET | Freq: Every day | ORAL | Status: DC
  Administered 2020-12-18 – 2020-12-24 (×7): 2.5 mg via ORAL
  Filled 2020-12-18 (×10): qty 1

## 2020-12-18 NOTE — Plan of Care (Signed)

## 2020-12-18 NOTE — Plan of Care (Signed)
  Problem: Pain Managment: Goal: General experience of comfort will improve Outcome: Progressing   
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
## 2020-12-18 NOTE — TOC CAGE-AID Note (Signed)
Transition of Care [facility]) - CAGE-AID Screening   Patient Details  Name: Kathleen Daniels MRN: [id number] Date of Birth: November 30, 1945  [facility] CM/SW Contact:    Verneal Wiers C Tarpley-Carter, [place] Phone Number: 12/18/2020, 11:49 AM   Clinical Narrative: Pt participated in [place].  Pt stated she does not use substance or ETOH.  Pt was not offered resources, due to no usage of substance or ETOH.     Guila Owensby Tarpley-Carter, MSW, LCSW-A Pronouns:  She/Her/Hers [facility] Clinical Social Worker Direct Number:  [phone] [email or web address]    CAGE-AID Screening:    Have You Ever Felt You Ought to Cut Down on Your Drinking or Drug Use?: No Have People Annoyed You By [employer] Your Drinking Or Drug Use?: No Have You Felt Bad Or Guilty About Your Drinking Or Drug Use?: No Have You Ever Had a Drink or Used Drugs First Thing In The Morning to Steady Your Nerves or to Get Rid of a Hangover?: No CAGE-AID Score: 0  Substance Abuse Education Offered: No

## 2020-12-19 ENCOUNTER — Inpatient Hospital Stay (HOSPITAL_COMMUNITY): Payer: Medicare Other

## 2020-12-19 MED ORDER — TRAMADOL HCL 50 MG PO TABS
100.0000 mg | ORAL_TABLET | Freq: Four times a day (QID) | ORAL | Status: DC
  Administered 2020-12-19 – 2020-12-25 (×24): 100 mg via ORAL
  Filled 2020-12-19 (×24): qty 2

## 2020-12-19 MED ORDER — SIMETHICONE 80 MG PO CHEW
80.0000 mg | CHEWABLE_TABLET | Freq: Four times a day (QID) | ORAL | Status: DC | PRN
  Administered 2020-12-19 – 2020-12-20 (×2): 80 mg via ORAL
  Filled 2020-12-19 (×2): qty 1

## 2020-12-19 MED ORDER — ENOXAPARIN SODIUM 40 MG/0.4ML IJ SOSY
40.0000 mg | PREFILLED_SYRINGE | INTRAMUSCULAR | Status: DC
  Administered 2020-12-19 – 2020-12-25 (×7): 40 mg via SUBCUTANEOUS
  Filled 2020-12-19 (×7): qty 0.4
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
## 2020-12-19 NOTE — Progress Notes (Signed)
Inpatient Rehab Admissions Coordinator:   Per therapy recommendations, patient was screened for CIR candidacy by Clemens Catholic, MS, CCC-SLP . At this time, Pt. is tolerating only head elevation to 45 degrees and has not yet attempted transfers or OOB. I do not think that pt. Is able to tolerate  the intensity of CIR at this time; however,  Pt. may have potential to progress to becoming a potential CIR candidate, so CIR admissions team will follow and monitor for progress and participation with therapies, rescreen regularly,  and place consult order if Pt. appears to be an appropriate candidate. Please contact me with any questions.   Clemens Catholic, [place], [place] Admissions Coordinator  [phone] ([place]) [phone] (office)

## 2020-12-19 NOTE — Progress Notes (Signed)
Patient was seen and evaluated today.  Please see my attestation note for Kathleen Daniels's consult note on 10/13.  Shona Needles, MD [facility] [phone] (office) [email or web address]
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
## 2020-12-19 NOTE — H&P (Signed)
Please see Consult note from 10/13 for full orthopaedic H&P.

## 2020-12-20 MED ORDER — FLEET ENEMA 7-19 GM/118ML RE ENEM
1.0000 | ENEMA | Freq: Every day | RECTAL | Status: DC | PRN
  Administered 2020-12-21: 1 via RECTAL
  Filled 2020-12-20: qty 1

## 2020-12-20 NOTE — Plan of Care (Signed)
  Problem: Nutrition: Goal: Adequate nutrition will be maintained Outcome: Progressing   Problem: Pain Managment: Goal: General experience of comfort will improve Outcome: Progressing   

## 2020-12-20 NOTE — Progress Notes (Signed)
Inpatient Rehab Admissions Coordinator:   Per therapy recommendations,  patient was screened for CIR candidacy by Clemens Catholic, MS, CCC-SLP. At this time, Pt. Appears to demonstrate medical necessity, functional decline, and ability to tolerate intensity of CIR. Pt. is a potential candidate for CIR. Note that Pt.'s medicare advantage plan may not approve CIR for her diagnoses.  I will place   order for rehab consult per protocol for full assessment. Please contact me any with questions.  Clemens Catholic, [place], [place] Admissions Coordinator  [phone] ([place]) [phone] (office)
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
## 2020-12-21 ENCOUNTER — Inpatient Hospital Stay (HOSPITAL_COMMUNITY): Payer: Medicare Other

## 2020-12-21 NOTE — Progress Notes (Signed)
Inpatient Rehab Admissions Coordinator:   I spoke with Pt. And daughter regarding potential CIR admit. They would like to take a day to consider CIR vs SNF. I will meet with them tomorrow to discuss.   Clemens Catholic, [place], [place] Admissions Coordinator  [phone] ([place]) [phone] (office)
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
## 2020-12-22 MED ORDER — MORPHINE SULFATE (PF) 2 MG/ML IV SOLN
1.0000 mg | INTRAVENOUS | Status: DC | PRN
  Administered 2020-12-24: 2 mg via INTRAVENOUS
  Administered 2020-12-24: 1 mg via INTRAVENOUS
  Administered 2020-12-25: 2 mg via INTRAVENOUS
  Filled 2020-12-22 (×3): qty 1
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
## 2020-12-22 NOTE — Progress Notes (Signed)
Orthopedic Tech Progress Note Patient Details:  Kathleen Daniels [birth year] [id number]  Ortho Devices Type of Ortho Device: Volar splint, Other (comment) Ortho Device/Splint Location: RUE Ortho Device/Splint Interventions: Ordered, Application   Post Interventions Patient Tolerated: Well Instructions Provided: [facility] [place] 12/22/2020, 3:14 PM
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
## 2020-12-22 NOTE — Progress Notes (Signed)
Inpatient Rehab Admissions Coordinator:     I spoke with Pt, an her daughter regarding CIR vs. SNF. They state that Pt. Will not have 24/7 support at d/c and feel like she would do better with lower intensity rehab at [facility]. CIR will sign off and I will notify TOC.   Clemens Catholic, [place], [place] Admissions Coordinator  [phone] ([place]) [phone] (office)
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
## 2020-12-23 NOTE — Care Management Important Message (Signed)
Important Message  Patient Details  Name: Kathleen Daniels MRN: [id number] Date of Birth: 05/30/1945   Medicare Important Message Given:  Yes     Dalal Livengood Montine Circle 12/23/2020, 3:43 PM

## 2020-12-23 NOTE — Progress Notes (Signed)
Inpatient Rehab Admissions Coordinator:    Pt. Is now requesting CIR instead of SNF. I spoke to Pt. And her daughter in law and they state that Pt. Will have 24/7 supervision following discharge. I submitted for insurance auth this afternoon and will pursue for potential admit pending insurance auth and bed availability.   Clemens Catholic, [place], [place] Admissions Coordinator  [phone] ([place]) [phone] (office)
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
## 2020-12-23 NOTE — Social Work (Signed)
CSW gave pt bed offers at bedside. Pt stated her and her daughter discussed it, and they believe that pt will be better suited for inpatient rehab. CSW informed CIR admission coordinators.   Emeterio Reeve, LCSW Clinical Social Worker

## 2020-12-24 MED ORDER — ACETAMINOPHEN 325 MG PO TABS
650.0000 mg | ORAL_TABLET | Freq: Four times a day (QID) | ORAL | Status: DC
  Administered 2020-12-24 – 2020-12-25 (×4): 650 mg via ORAL
  Filled 2020-12-24 (×4): qty 2
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
## 2020-12-24 NOTE — Progress Notes (Signed)
Inpatient Rehab Admissions Coordinator:   I do not have insurance auth or a CIR bed for this Pt. Today. I will follow for potential admit pending insurance auth and medical readiness.  Clemens Catholic, [place], [place] Admissions Coordinator  [phone] ([place]) [phone] (office)
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
## 2020-12-25 ENCOUNTER — Encounter (HOSPITAL_COMMUNITY): Payer: Self-pay | Admitting: Physical Medicine and Rehabilitation

## 2020-12-25 ENCOUNTER — Other Ambulatory Visit: Payer: Self-pay

## 2020-12-25 ENCOUNTER — Inpatient Hospital Stay (HOSPITAL_COMMUNITY)
Admission: RE | Admit: 2020-12-25 | Discharge: 2021-01-10 | DRG: 560 | Disposition: A | Discharge status: Home / Self Care | Payer: Medicare Other | Source: Intra-hospital | Attending: Physical Medicine and Rehabilitation | Admitting: Physical Medicine and Rehabilitation

## 2020-12-25 DIAGNOSIS — K5900 Constipation, unspecified: Secondary | ICD-10-CM | POA: Diagnosis present

## 2020-12-25 DIAGNOSIS — S32409A Unspecified fracture of unspecified acetabulum, initial encounter for closed fracture: Secondary | ICD-10-CM

## 2020-12-25 DIAGNOSIS — S32402A Unspecified fracture of left acetabulum, initial encounter for closed fracture: Secondary | ICD-10-CM | POA: Diagnosis not present

## 2020-12-25 DIAGNOSIS — S329XXA Fracture of unspecified parts of lumbosacral spine and pelvis, initial encounter for closed fracture: Secondary | ICD-10-CM | POA: Diagnosis not present

## 2020-12-25 DIAGNOSIS — M160 Bilateral primary osteoarthritis of hip: Secondary | ICD-10-CM | POA: Diagnosis not present

## 2020-12-25 DIAGNOSIS — F32A Depression, unspecified: Secondary | ICD-10-CM | POA: Diagnosis present

## 2020-12-25 DIAGNOSIS — K5903 Drug induced constipation: Secondary | ICD-10-CM

## 2020-12-25 DIAGNOSIS — F4311 Post-traumatic stress disorder, acute: Secondary | ICD-10-CM

## 2020-12-25 DIAGNOSIS — S52691D Other fracture of lower end of right ulna, subsequent encounter for closed fracture with routine healing: Secondary | ICD-10-CM

## 2020-12-25 DIAGNOSIS — D62 Acute posthemorrhagic anemia: Secondary | ICD-10-CM | POA: Diagnosis not present

## 2020-12-25 DIAGNOSIS — S52601D Unspecified fracture of lower end of right ulna, subsequent encounter for closed fracture with routine healing: Secondary | ICD-10-CM | POA: Diagnosis not present

## 2020-12-25 DIAGNOSIS — S52609A Unspecified fracture of lower end of unspecified ulna, initial encounter for closed fracture: Secondary | ICD-10-CM

## 2020-12-25 DIAGNOSIS — S32302A Unspecified fracture of left ilium, initial encounter for closed fracture: Secondary | ICD-10-CM | POA: Diagnosis not present

## 2020-12-25 DIAGNOSIS — M25531 Pain in right wrist: Secondary | ICD-10-CM | POA: Diagnosis not present

## 2020-12-25 DIAGNOSIS — F419 Anxiety disorder, unspecified: Secondary | ICD-10-CM | POA: Diagnosis present

## 2020-12-25 DIAGNOSIS — S32512A Fracture of superior rim of left pubis, initial encounter for closed fracture: Secondary | ICD-10-CM | POA: Diagnosis not present

## 2020-12-25 DIAGNOSIS — G8911 Acute pain due to trauma: Secondary | ICD-10-CM | POA: Diagnosis not present

## 2020-12-25 DIAGNOSIS — E871 Hypo-osmolality and hyponatremia: Secondary | ICD-10-CM | POA: Diagnosis present

## 2020-12-25 DIAGNOSIS — S50311D Abrasion of right elbow, subsequent encounter: Secondary | ICD-10-CM | POA: Diagnosis not present

## 2020-12-25 DIAGNOSIS — Z90710 Acquired absence of both cervix and uterus: Secondary | ICD-10-CM

## 2020-12-25 DIAGNOSIS — S32401S Unspecified fracture of right acetabulum, sequela: Secondary | ICD-10-CM | POA: Diagnosis not present

## 2020-12-25 DIAGNOSIS — L03114 Cellulitis of left upper limb: Secondary | ICD-10-CM | POA: Diagnosis present

## 2020-12-25 DIAGNOSIS — E46 Unspecified protein-calorie malnutrition: Secondary | ICD-10-CM

## 2020-12-25 DIAGNOSIS — S52601A Unspecified fracture of lower end of right ulna, initial encounter for closed fracture: Secondary | ICD-10-CM | POA: Diagnosis not present

## 2020-12-25 DIAGNOSIS — R3915 Urgency of urination: Secondary | ICD-10-CM

## 2020-12-25 DIAGNOSIS — E8809 Other disorders of plasma-protein metabolism, not elsewhere classified: Secondary | ICD-10-CM | POA: Diagnosis not present

## 2020-12-25 DIAGNOSIS — R112 Nausea with vomiting, unspecified: Secondary | ICD-10-CM | POA: Diagnosis not present

## 2020-12-25 DIAGNOSIS — M19012 Primary osteoarthritis, left shoulder: Secondary | ICD-10-CM | POA: Diagnosis not present

## 2020-12-25 DIAGNOSIS — S32402D Unspecified fracture of left acetabulum, subsequent encounter for fracture with routine healing: Secondary | ICD-10-CM | POA: Diagnosis not present

## 2020-12-25 MED ORDER — TRAZODONE HCL 50 MG PO TABS
25.0000 mg | ORAL_TABLET | Freq: Every evening | ORAL | Status: DC | PRN
  Administered 2020-12-25 – 2021-01-03 (×6): 50 mg via ORAL
  Filled 2020-12-25 (×7): qty 1

## 2020-12-25 MED ORDER — DOXYCYCLINE HYCLATE 100 MG PO TABS
100.0000 mg | ORAL_TABLET | Freq: Two times a day (BID) | ORAL | Status: DC
  Administered 2020-12-25 – 2021-01-01 (×14): 100 mg via ORAL
  Filled 2020-12-25 (×14): qty 1

## 2020-12-25 MED ORDER — PROCHLORPERAZINE 25 MG RE SUPP
12.5000 mg | Freq: Four times a day (QID) | RECTAL | Status: DC | PRN
Start: 2020-12-25 — End: 2021-01-10

## 2020-12-25 MED ORDER — ACETAMINOPHEN 325 MG PO TABS
325.0000 mg | ORAL_TABLET | ORAL | Status: DC | PRN
  Administered 2020-12-26 – 2021-01-08 (×3): 650 mg via ORAL
  Administered 2021-01-08: 325 mg via ORAL
  Filled 2020-12-25 (×4): qty 2

## 2020-12-25 MED ORDER — ALUM & MAG HYDROXIDE-SIMETH 200-200-20 MG/5ML PO SUSP: 30.0000 mL | ORAL | Status: DC | PRN

## 2020-12-25 MED ORDER — SIMETHICONE 80 MG PO CHEW: 80.0000 mg | CHEWABLE_TABLET | Freq: Four times a day (QID) | ORAL | Status: DC | PRN

## 2020-12-25 MED ORDER — ENOXAPARIN SODIUM 40 MG/0.4ML IJ SOSY
40.0000 mg | PREFILLED_SYRINGE | INTRAMUSCULAR | Status: DC
  Administered 2020-12-26 – 2021-01-09 (×15): 40 mg via SUBCUTANEOUS
  Filled 2020-12-25 (×15): qty 0.4

## 2020-12-25 MED ORDER — ENOXAPARIN SODIUM 40 MG/0.4ML IJ SOSY: 40.0000 mg | PREFILLED_SYRINGE | INTRAMUSCULAR | Status: DC

## 2020-12-25 MED ORDER — POLYETHYLENE GLYCOL 3350 17 G PO PACK: 17.0000 g | PACK | Freq: Every day | ORAL | Status: DC | PRN

## 2020-12-25 MED ORDER — BUPROPION HCL ER (XL) 150 MG PO TB24
150.0000 mg | ORAL_TABLET | Freq: Three times a day (TID) | ORAL | Status: DC
  Administered 2020-12-25 – 2021-01-10 (×47): 150 mg via ORAL
  Filled 2020-12-25 (×47): qty 1

## 2020-12-25 MED ORDER — GUAIFENESIN-DM 100-10 MG/5ML PO SYRP: 5.0000 mL | ORAL_SOLUTION | Freq: Four times a day (QID) | ORAL | Status: DC | PRN

## 2020-12-25 MED ORDER — OXYCODONE HCL 5 MG PO TABS
10.0000 mg | ORAL_TABLET | ORAL | Status: DC | PRN
  Administered 2020-12-25 – 2021-01-02 (×28): 15 mg via ORAL
  Administered 2021-01-03 – 2021-01-04 (×4): 10 mg via ORAL
  Administered 2021-01-04: 15 mg via ORAL
  Administered 2021-01-04 – 2021-01-05 (×3): 10 mg via ORAL
  Administered 2021-01-05: 15 mg via ORAL
  Administered 2021-01-05 – 2021-01-07 (×8): 10 mg via ORAL
  Administered 2021-01-07: 15 mg via ORAL
  Administered 2021-01-07 – 2021-01-09 (×6): 10 mg via ORAL
  Filled 2020-12-25 (×2): qty 3
  Filled 2020-12-25: qty 2
  Filled 2020-12-25 (×3): qty 3
  Filled 2020-12-25: qty 2
  Filled 2020-12-25 (×5): qty 3
  Filled 2020-12-25 (×2): qty 2
  Filled 2020-12-25 (×3): qty 3
  Filled 2020-12-25: qty 2
  Filled 2020-12-25 (×5): qty 3
  Filled 2020-12-25: qty 2
  Filled 2020-12-25: qty 3
  Filled 2020-12-25: qty 2
  Filled 2020-12-25 (×2): qty 3
  Filled 2020-12-25 (×2): qty 2
  Filled 2020-12-25: qty 3
  Filled 2020-12-25: qty 2
  Filled 2020-12-25 (×2): qty 3
  Filled 2020-12-25: qty 2
  Filled 2020-12-25 (×2): qty 3
  Filled 2020-12-25: qty 2
  Filled 2020-12-25 (×3): qty 3
  Filled 2020-12-25 (×2): qty 2
  Filled 2020-12-25 (×2): qty 3
  Filled 2020-12-25 (×5): qty 2
  Filled 2020-12-25 (×2): qty 3

## 2020-12-25 MED ORDER — SENNA 8.6 MG PO TABS
1.0000 | ORAL_TABLET | Freq: Two times a day (BID) | ORAL | Status: DC
  Administered 2020-12-25 – 2020-12-31 (×11): 8.6 mg via ORAL
  Filled 2020-12-25 (×12): qty 1

## 2020-12-25 MED ORDER — POLYVINYL ALCOHOL 1.4 % OP SOLN
1.0000 [drp] | OPHTHALMIC | Status: DC | PRN
  Administered 2020-12-27: 1 [drp] via OPHTHALMIC
  Filled 2020-12-25: qty 15

## 2020-12-25 MED ORDER — DIPHENHYDRAMINE HCL 12.5 MG/5ML PO ELIX: 12.5000 mg | ORAL_SOLUTION | Freq: Four times a day (QID) | ORAL | Status: DC | PRN

## 2020-12-25 MED ORDER — ACETAMINOPHEN 325 MG PO TABS
650.0000 mg | ORAL_TABLET | Freq: Four times a day (QID) | ORAL | Status: DC
  Administered 2020-12-25 – 2021-01-10 (×54): 650 mg via ORAL
  Filled 2020-12-25 (×53): qty 2

## 2020-12-25 MED ORDER — PROCHLORPERAZINE MALEATE 5 MG PO TABS
5.0000 mg | ORAL_TABLET | Freq: Four times a day (QID) | ORAL | Status: DC | PRN
  Administered 2020-12-26 – 2021-01-01 (×3): 10 mg via ORAL
  Administered 2021-01-07 (×2): 5 mg via ORAL
  Filled 2020-12-25: qty 1
  Filled 2020-12-25 (×3): qty 2
  Filled 2020-12-25: qty 1

## 2020-12-25 MED ORDER — PROCHLORPERAZINE EDISYLATE 10 MG/2ML IJ SOLN: 5.0000 mg | Freq: Four times a day (QID) | INTRAMUSCULAR | Status: DC | PRN

## 2020-12-25 MED ORDER — BISACODYL 10 MG RE SUPP
10.0000 mg | Freq: Every day | RECTAL | Status: DC | PRN
Start: 2020-12-25 — End: 2021-01-10

## 2020-12-25 MED ORDER — TRAMADOL HCL 50 MG PO TABS
100.0000 mg | ORAL_TABLET | Freq: Four times a day (QID) | ORAL | Status: DC
  Administered 2020-12-25 – 2020-12-26 (×3): 100 mg via ORAL
  Filled 2020-12-25 (×4): qty 2

## 2020-12-25 MED ORDER — ALPRAZOLAM 0.5 MG PO TABS
0.7500 mg | ORAL_TABLET | Freq: Every evening | ORAL | Status: DC | PRN
  Administered 2020-12-30 – 2021-01-09 (×10): 0.75 mg via ORAL
  Filled 2020-12-25 (×11): qty 1

## 2020-12-25 MED ORDER — FLEET ENEMA 7-19 GM/118ML RE ENEM: 1.0000 | ENEMA | Freq: Once | RECTAL | Status: DC | PRN

## 2020-12-25 MED ORDER — FINASTERIDE 2.5 MG HALF TABLET
2.5000 mg | ORAL_TABLET | Freq: Every day | ORAL | Status: DC
  Administered 2020-12-26 – 2021-01-10 (×16): 2.5 mg via ORAL
  Filled 2020-12-25 (×16): qty 1

## 2020-12-25 MED ORDER — DOCUSATE SODIUM 100 MG PO CAPS
100.0000 mg | ORAL_CAPSULE | Freq: Two times a day (BID) | ORAL | Status: DC
  Administered 2020-12-25 – 2020-12-31 (×12): 100 mg via ORAL
  Filled 2020-12-25 (×12): qty 1
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
## 2020-12-25 NOTE — Progress Notes (Signed)
IP rehab admissions - Met with patient this am.  She is agreeable to CIR.  PA with Dr. Doreatha Martin came into room and I have clearance for CIR admission for today.  Bed available and will admit to inpatient rehab today.  Call for questions.  [phone]
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
## 2020-12-25 NOTE — Progress Notes (Signed)
Patient ID: Kathleen Daniels, female   DOB: 11-Jul-1945, 75 y.o.   MRN: [id number] Admitted to rehab, reviewed therapy schedule and plan of care. Reviewed medications and team conference. Reviewed safety plan and pain medication. Margarito Liner

## 2020-12-25 NOTE — Progress Notes (Signed)
Patient ID: Kathleen Daniels, female   DOB: 1945-10-16, 75 y.o.   MRN: [id number] Met with the patient and brother to introduce self and review role of the nurse CM. Discussed medical issues and medications, with rehab therapy schedule and plan of care. Continue to follow along to discharge to address educational needs and collaborate with the team to facilitate preparation for discharge. Margarito Liner

## 2020-12-25 NOTE — Progress Notes (Signed)
Patient transferred to 4M06.
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
## 2020-12-26 ENCOUNTER — Inpatient Hospital Stay (HOSPITAL_COMMUNITY): Payer: Medicare Other

## 2020-12-26 DIAGNOSIS — R112 Nausea with vomiting, unspecified: Secondary | ICD-10-CM | POA: Diagnosis not present

## 2020-12-26 DIAGNOSIS — G8911 Acute pain due to trauma: Secondary | ICD-10-CM

## 2020-12-26 DIAGNOSIS — E8809 Other disorders of plasma-protein metabolism, not elsewhere classified: Secondary | ICD-10-CM

## 2020-12-26 DIAGNOSIS — S52601D Unspecified fracture of lower end of right ulna, subsequent encounter for closed fracture with routine healing: Secondary | ICD-10-CM | POA: Diagnosis not present

## 2020-12-26 DIAGNOSIS — D62 Acute posthemorrhagic anemia: Secondary | ICD-10-CM | POA: Diagnosis not present

## 2020-12-26 DIAGNOSIS — E46 Unspecified protein-calorie malnutrition: Secondary | ICD-10-CM

## 2020-12-26 DIAGNOSIS — S32402D Unspecified fracture of left acetabulum, subsequent encounter for fracture with routine healing: Secondary | ICD-10-CM | POA: Diagnosis not present

## 2020-12-26 DIAGNOSIS — S32402A Unspecified fracture of left acetabulum, initial encounter for closed fracture: Secondary | ICD-10-CM

## 2020-12-26 DIAGNOSIS — E871 Hypo-osmolality and hyponatremia: Secondary | ICD-10-CM

## 2020-12-26 LAB — CBC WITH DIFFERENTIAL/PLATELET
Abs Immature Granulocytes: 0.08 10*3/uL — ABNORMAL HIGH (ref 0.00–0.07)
Basophils Absolute: 0.1 10*3/uL (ref 0.0–0.1)
Basophils Relative: 2 %
Eosinophils Absolute: 0.1 10*3/uL (ref 0.0–0.5)
Eosinophils Relative: 1 %
HCT: 27.5 % — ABNORMAL LOW (ref 36.0–46.0)
Hemoglobin: 9.7 g/dL — ABNORMAL LOW (ref 12.0–15.0)
Immature Granulocytes: 2 %
Lymphocytes Relative: 18 %
Lymphs Abs: 1 10*3/uL (ref 0.7–4.0)
MCH: 30.8 pg (ref 26.0–34.0)
MCHC: 35.3 g/dL (ref 30.0–36.0)
MCV: 87.3 fL (ref 80.0–100.0)
Monocytes Absolute: 0.6 10*3/uL (ref 0.1–1.0)
Monocytes Relative: 11 %
Neutro Abs: 3.6 10*3/uL (ref 1.7–7.7)
Neutrophils Relative %: 66 %
Platelets: 328 10*3/uL (ref 150–400)
RBC: 3.15 MIL/uL — ABNORMAL LOW (ref 3.87–5.11)
RDW: 12.7 % (ref 11.5–15.5)
WBC: 5.3 10*3/uL (ref 4.0–10.5)
nRBC: 0 % (ref 0.0–0.2)

## 2020-12-26 LAB — COMPREHENSIVE METABOLIC PANEL
ALT: 33 U/L (ref 0–44)
AST: 28 U/L (ref 15–41)
Albumin: 2.7 g/dL — ABNORMAL LOW (ref 3.5–5.0)
Alkaline Phosphatase: 70 U/L (ref 38–126)
Anion gap: 7 (ref 5–15)
BUN: 16 mg/dL (ref 8–23)
CO2: 26 mmol/L (ref 22–32)
Calcium: 8.7 mg/dL — ABNORMAL LOW (ref 8.9–10.3)
Chloride: 95 mmol/L — ABNORMAL LOW (ref 98–111)
Creatinine, Ser: 0.71 mg/dL (ref 0.44–1.00)
GFR, Estimated: >60 mL/min (ref >60)
Glucose, Bld: 106 mg/dL — ABNORMAL HIGH (ref 70–99)
Potassium: 4.4 mmol/L (ref 3.5–5.1)
Sodium: 128 mmol/L — ABNORMAL LOW (ref 135–145)
Total Bilirubin: 0.6 mg/dL (ref 0.3–1.2)
Total Protein: 5.3 g/dL — ABNORMAL LOW (ref 6.5–8.1)

## 2020-12-26 LAB — OSMOLALITY, URINE: Osmolality, Ur: 263 mOsm/kg — ABNORMAL LOW (ref 300–900)

## 2020-12-26 MED ORDER — ONDANSETRON HCL 4 MG PO TABS
2.0000 mg | ORAL_TABLET | Freq: Three times a day (TID) | ORAL | Status: DC
  Administered 2020-12-26 – 2021-01-10 (×38): 2 mg via ORAL
  Filled 2020-12-26 (×46): qty 0.5

## 2020-12-26 MED ORDER — ACYCLOVIR 5 % EX OINT
TOPICAL_OINTMENT | Freq: Every day | CUTANEOUS | Status: DC
  Administered 2020-12-26 – 2020-12-29 (×4): 1 via TOPICAL
  Filled 2020-12-26: qty 15

## 2020-12-26 MED ORDER — CAMPHOR-MENTHOL 0.5-0.5 % EX LOTN
TOPICAL_LOTION | CUTANEOUS | Status: DC | PRN
  Administered 2020-12-26: 1 via TOPICAL
  Filled 2020-12-26: qty 222

## 2020-12-26 MED ORDER — GABAPENTIN 100 MG PO CAPS
100.0000 mg | ORAL_CAPSULE | Freq: Every day | ORAL | Status: DC
  Administered 2020-12-26 – 2021-01-09 (×15): 100 mg via ORAL
  Filled 2020-12-26 (×15): qty 1

## 2020-12-26 MED ORDER — SODIUM CHLORIDE 1 G PO TABS
1.0000 g | ORAL_TABLET | Freq: Three times a day (TID) | ORAL | Status: DC
  Administered 2020-12-26 – 2021-01-09 (×42): 1 g via ORAL
  Filled 2020-12-26 (×42): qty 1

## 2020-12-26 MED ORDER — PROSOURCE PLUS PO LIQD
30.0000 mL | Freq: Two times a day (BID) | ORAL | Status: DC
Start: 2020-12-26 — End: 2021-01-10
  Administered 2020-12-26 – 2021-01-09 (×27): 30 mL via ORAL
  Filled 2020-12-26 (×30): qty 30
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
## 2020-12-26 NOTE — Progress Notes (Signed)
[facility] Individual Statement of Services  Patient Name:  Kathleen Daniels  Date:  12/26/2020  Welcome to the [facility].  Our goal is to provide you with an individualized program based on your diagnosis and situation, designed to meet your specific needs.  With this comprehensive rehabilitation program, you will be expected to participate in at least 3 hours of rehabilitation therapies Monday-Friday, with modified therapy programming on the weekends.  Your rehabilitation program will include the following services:  Physical Therapy (PT), Occupational Therapy (OT), 24 hour per day rehabilitation nursing, Care Coordinator, Rehabilitation Medicine, Nutrition Services, and Pharmacy Services  Weekly team conferences will be held on Tuesday to discuss your progress.  Your Inpatient Rehabilitation Care Coordinator will talk with you frequently to get your input and to update you on team discussions.  Team conferences with you and your family in attendance may also be held.  Expected length of stay: 10-14 days  Overall anticipated outcome: Over all supervision  Depending on your progress and recovery, your program may change. Your Inpatient Rehabilitation Care Coordinator will coordinate services and will keep you informed of any changes. Your Inpatient Rehabilitation Care Coordinator's name and contact numbers are listed  below.  The following services may also be recommended but are not provided by the [facility] will be made to provide these services after discharge if needed.  Arrangements include referral to agencies that provide these services.  Your insurance has been verified to be:  [place] primary doctor is:  Cristie Hem  Pertinent information will be shared with your doctor and your insurance  company.  Inpatient Rehabilitation Care Coordinator:  Ovidio Kin, [place] or Emilia Beck  Information discussed with and copy given to patient by: [name], 12/26/2020, 10:21 AM
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
## 2020-12-26 NOTE — Progress Notes (Signed)
Occupational Therapy Session Note  Patient Details  Name: QUANETTA TRUSS MRN: [id number] Date of Birth: [birth year]  Today's Date: 12/26/2020 OT Individual Time: 1300-1400 OT Individual Time Calculation (min): 60 min    Short Term Goals: Week 1:  OT Short Term Goal 1 (Week 1): Pt will be able to don pants with min A OT Short Term Goal 2 (Week 1): Pt will complete toilet transfers with CGA OT Short Term Goal 3 (Week 1): Pt will be able to adjust clothing pre and post toielting with CGA  Skilled Therapeutic Interventions/Progress Updates:  Pt greeted supine in bed reporting pain and nausea ( provided rest breaks as needed throuhgout session as pain mgmt strategy). Pt reports she feels like she "tweaked" her arm in an earlier session ( alerted PA) but agreeable OT intervention. Session focus on functional mobility, increasing activity tolerance and BADL reeducation. Pt required MOD A for bed mobility with pt preferring to exit to L side of bed. Pt donned pants from EOB with MOD A needing assist to thread LLE but able to thread RLE, MOD A to pull pants up to waist line in standing.  Pt sit<>stand with MIN A, pt with difficulty hopping but able to do hop a couple times with MOD A, pt prefers to heel/toe pivot. Pt completed ~ 5 ft of functional mobility to sink for hair washing to increase overall activity tolerance. Washed pts hair in sink with total A with pt directing level of care. Positioned pts RUE on pillow during hair washing as pt continues to report pain in wrist. Repositioned pts platform more anteriorly as pt reports difficulty keeping RUE laterally d/t pain. Pt completed functional mobility back to bed with MIN A, able to hop better this trial. MOD A to return to supine needing assist to elevate BLEs back to bed. pt left supine in bed with bed alarm activated and all needs within reach.                    Therapy Documentation Precautions:  Precautions Precautions: Fall Required Braces  or Orthoses: Splint/Cast Restrictions Weight Bearing Restrictions: Yes RUE Weight Bearing: Non weight bearing (Distal to elbow) LLE Weight Bearing: Touchdown weight bearing Other Position/Activity Restrictions: ok to wb through R elbow on PFRW      Therapy/Group: Individual Therapy  Precious Haws 12/26/2020, 2:54 PM

## 2020-12-26 NOTE — Progress Notes (Signed)
Bilateral lower extremity venous duplex has been completed. Preliminary results can be found in CV Proc through chart review.   12/26/20 1:38 PM Kathleen Daniels RVT
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
## 2020-12-26 NOTE — Progress Notes (Signed)
Patient with onset of right forearm pain this am--follow up Xrays with stable Fx. Will add low dose gabapentin at nights. Encouraged fluids besides water--repeat BMET in am. May need forearm X rays if pain continues.

## 2020-12-26 NOTE — Progress Notes (Signed)
Inpatient Rehabilitation Admission Medication Review by a Pharmacist  A complete drug regimen review was completed for this patient to identify any potential clinically significant medication issues.  High Risk Drug Classes Is patient taking? Indication by Medication  Antipsychotic Yes PRN compazine n/v  Anticoagulant Yes Lovenox DVT px while here  Antibiotic No   Opioid No Oxycodone PRN pain  Antiplatelet No   Hypoglycemics/insulin No   Vasoactive Medication No   Chemotherapy No   Other Yes Finasteride ?     Type of Medication Issue Identified Description of Issue Recommendation(s)  Drug Interaction(s) (clinically significant)     Duplicate Therapy     Allergy     No Medication Administration End Date     Incorrect Dose     Additional Drug Therapy Needed     Significant med changes from prior encounter (inform family/care partners about these prior to discharge).    Other       Clinically significant medication issues were identified that warrant physician communication and completion of prescribed/recommended actions by midnight of the next day:  No  Name of provider notified for urgent issues identified:   Provider Method of Notification:     Pharmacist comments:   Time spent performing this drug regimen review (minutes):  [id number], PharmD, [place], AAHIVP, CPP Infectious Disease Pharmacist 12/25/2020 3:46 PM
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
## 2020-12-26 NOTE — Progress Notes (Signed)
Inpatient Rehabilitation  Patient information reviewed and entered into eRehab system by Makylah Bossard Jay Kempe, OTR/L.   Information including medical coding, functional ability and quality indicators will be reviewed and updated through discharge.    
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
## 2020-12-26 NOTE — Plan of Care (Signed)
  Problem: RH Balance Goal: LTG Patient will maintain dynamic sitting balance (PT) Description: LTG:  Patient will maintain dynamic sitting balance with assistance during mobility activities (PT) Flowsheets (Taken 12/26/2020 1619) LTG: Pt will maintain dynamic sitting balance during mobility activities with:: Independent with assistive device  Goal: LTG Patient will maintain dynamic standing balance (PT) Description: LTG:  Patient will maintain dynamic standing balance with assistance during mobility activities (PT) Flowsheets (Taken 12/26/2020 1619) LTG: Pt will maintain dynamic standing balance during mobility activities with:: (LRAD) Supervision/Verbal cueing   Problem: Sit to Stand Goal: LTG:  Patient will perform sit to stand with assistance level (PT) Description: LTG:  Patient will perform sit to stand with assistance level (PT) Flowsheets (Taken 12/26/2020 1619) LTG: PT will perform sit to stand in preparation for functional mobility with assistance level: (LRAD) Supervision/Verbal cueing   Problem: RH Bed Mobility Goal: LTG Patient will perform bed mobility with assist (PT) Description: LTG: Patient will perform bed mobility with assistance, with/without cues (PT). Flowsheets (Taken 12/26/2020 1619) LTG: Pt will perform bed mobility with assistance level of: Independent with assistive device    Problem: RH Bed to Chair Transfers Goal: LTG Patient will perform bed/chair transfers w/assist (PT) Description: LTG: Patient will perform bed to chair transfers with assistance (PT). Flowsheets (Taken 12/26/2020 1619) LTG: Pt will perform Bed to Chair Transfers with assistance level: (LRAD) Supervision/Verbal cueing   Problem: RH Car Transfers Goal: LTG Patient will perform car transfers with assist (PT) Description: LTG: Patient will perform car transfers with assistance (PT). Flowsheets (Taken 12/26/2020 1619) LTG: Pt will perform car transfers with assist:: (LRAD)  Supervision/Verbal cueing   Problem: RH Furniture Transfers Goal: LTG Patient will perform furniture transfers w/assist (OT/PT) Description: LTG: Patient will perform furniture transfers  with assistance (OT/PT). Flowsheets (Taken 12/26/2020 1619) LTG: Pt will perform furniture transfers with assist:: (LRAD) Supervision/Verbal cueing   Problem: RH Ambulation Goal: LTG Patient will ambulate in home environment (PT) Description: LTG: Patient will ambulate in home environment, # of feet with assistance (PT). Flowsheets (Taken 12/26/2020 1619) LTG: Pt will ambulate in home environ  assist needed:: (LRAD) Supervision/Verbal cueing LTG: Ambulation distance in home environment: 50'   Problem: RH Wheelchair Mobility Goal: LTG Patient will propel w/c in controlled environment (PT) Description: LTG: Patient will propel wheelchair in controlled environment, # of feet with assist (PT) Flowsheets (Taken 12/26/2020 1619) LTG: Pt will propel w/c in controlled environ  assist needed:: Set up assist LTG: Propel w/c distance in controlled environment: 100' Goal: LTG Patient will propel w/c in home environment (PT) Description: LTG: Patient will propel wheelchair in home environment, # of feet with assistance (PT). Flowsheets (Taken 12/26/2020 1619) LTG: Pt will propel w/c in home environ  assist needed:: Set up assist Distance: wheelchair distance in controlled environment: 50   Problem: RH Stairs Goal: LTG Patient will ambulate up and down stairs w/assist (PT) Description: LTG: Patient will ambulate up and down # of stairs with assistance (PT) Flowsheets (Taken 12/26/2020 1619) LTG: Pt will ambulate up/down stairs assist needed:: (LRAD, no rail) Supervision/Verbal cueing LTG: Pt will  ambulate up and down number of stairs: 1

## 2020-12-27 LAB — BASIC METABOLIC PANEL
Anion gap: 5 (ref 5–15)
BUN: 13 mg/dL (ref 8–23)
CO2: 26 mmol/L (ref 22–32)
Calcium: 8.7 mg/dL — ABNORMAL LOW (ref 8.9–10.3)
Chloride: 97 mmol/L — ABNORMAL LOW (ref 98–111)
Creatinine, Ser: 0.75 mg/dL (ref 0.44–1.00)
GFR, Estimated: >60 mL/min (ref >60)
Glucose, Bld: 110 mg/dL — ABNORMAL HIGH (ref 70–99)
Potassium: 3.9 mmol/L (ref 3.5–5.1)
Sodium: 128 mmol/L — ABNORMAL LOW (ref 135–145)
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
## 2020-12-27 NOTE — Progress Notes (Signed)
Physical Therapy Session Note  Patient Details  Name: Kathleen Daniels MRN: [id number] Date of Birth: 02-Sep-1945  Today's Date: 12/27/2020 PT Individual Time: 1006-1106 PT Individual Time Calculation (min): 60 min   Short Term Goals: Week 1:  PT Short Term Goal 1 (Week 1): Pt will perform sit <> supine w/min A PT Short Term Goal 2 (Week 1): Pt will ambulate 25' w/LRAD and min A with good adherence to WB precautions PT Short Term Goal 3 (Week 1): Pt will perform sit <>stand w/min A and LRAD with good adherence to WB precuations PT Short Term Goal 4 (Week 1): Pt will initiate stair training  Skilled Therapeutic Interventions/Progress Updates:    Pt received supine in bed and agreeable to therapy session. Reports she still needs a lower profile w/c cushion to improve floor-to-seat height to improve LE support in chair. Therapist provided pt with contoured cushion. Upon initiating supine>sitting R EOB pt demonstrates inability to manage L LE without trying to compensate by moving it with her R LE.  Therefore transitioned to performing supine L LE exercises as follows:  - active assist heel slides progressed to pt using gait belt to perform self-active assist 2x10reps - active assist hip abduction/adduction 2x10 reps each  Educated pt on importance of performing these exercises and carrying over these movements during functional tasks.  Supine>sitting R EOB, HOB elevated and using bedrail, with pt demoing good management of R UE WBing restrictions and only min assist for L LE management to EOB. Sitting EOB donned R LE tennis shoe total assist for time and education pt on this allowing slightly longer leg length on this side to help maintain L LE TDWBing precautions. Repeated sit<>stands to/from EOB to R PFRW with mod assist for lifting to stand progressing to min assist - therapist adjusted R platform on walker for improved support and increased comfort - therapist educated pt on extending L LE out  in front of her to decrease risk of WBing during transition from sitting, therapist monitoring and pt with good ability to maintain [place] precaution.   L stand pivot EOB>w/c using R PFRW with heavy min assist for balance while pivoting and pt shuffling on R foot at this time as she is not yet able to initiate a hop. Pt agreeable to remain sitting in w/c for meal. Left with needs in reach, LEs supported on leg rests, R UE supported on pillow, and seat belt alarm on.  Therapy Documentation Precautions:  Precautions Precautions: Fall Required Braces or Orthoses: Splint/Cast Restrictions Weight Bearing Restrictions: Yes RUE Weight Bearing: Non weight bearing LLE Weight Bearing: Touchdown weight bearing Other Position/Activity Restrictions: ok to wb through R elbow on PFRW   Pain: Reports significant pain in L LE although unrated and pt reporting premedicated - provided rest breaks and distraction for pain management throughout session.     Therapy/Group: Individual Therapy  Tawana Scale , PT, DPT, NCS, CSRS  12/27/2020, 7:52 AM

## 2020-12-27 NOTE — Progress Notes (Signed)
Physical Therapy Session Note  Patient Details  Name: Kathleen Daniels MRN: [id number] Date of Birth: 1945/11/14  Today's Date: 12/27/2020 PT Individual Time: 6950-7225 PT Individual Time Calculation (min): 38 min   Short Term Goals: Week 1:  PT Short Term Goal 1 (Week 1): Pt will perform sit <> supine w/min A PT Short Term Goal 2 (Week 1): Pt will ambulate 25' w/LRAD and min A with good adherence to WB precautions PT Short Term Goal 3 (Week 1): Pt will perform sit <>stand w/min A and LRAD with good adherence to WB precuations PT Short Term Goal 4 (Week 1): Pt will initiate stair training  Skilled Therapeutic Interventions/Progress Updates:    Patient received sitting up in wc, agreeable to PT. She reports 9-10/10 pain in RUE and L LE. RN notified and provided pain rx partway through session. Visitors present and patient requested to stand to say goodbye. MinA sit > stand with up to [place] to maintain balance while she said goodbye. Patient able to perform wc mobility using L UE and R LE to propel wc x150ft. Slow and inefficient despite lowered wc height. May benefit from hemi height. Patient requesting to return to bed. MinA stand pivot to bed with MinA to return supine. Patient becoming tearful and anxious regarding possibly going home in a week. PT providing appropriate counseling. Patient may benefit from seeing neuropsychologist while in hospital and with OP follow up. Bed alarm on, call light within reach.   Therapy Documentation Precautions:  Precautions Precautions: Fall Required Braces or Orthoses: Splint/Cast Restrictions Weight Bearing Restrictions: Yes RUE Weight Bearing: Non weight bearing LLE Weight Bearing: Touchdown weight bearing Other Position/Activity Restrictions: ok to wb through R elbow on PFRW   Therapy/Group: Individual Therapy  Karoline Caldwell, PT, DPT, CBIS  12/27/2020, 7:48 AM

## 2020-12-27 NOTE — Progress Notes (Signed)
Occupational Therapy Session Note  Patient Details  Name: Kathleen Daniels MRN: [id number] Date of Birth: Apr 18, 1945  Today's Date: 12/27/2020 OT Individual Time: 0700-0730 OT Individual Time Calculation (min): 30 min    Short Term Goals: Week 1:  OT Short Term Goal 1 (Week 1): Pt will be able to don pants with min A OT Short Term Goal 2 (Week 1): Pt will complete toilet transfers with CGA OT Short Term Goal 3 (Week 1): Pt will be able to adjust clothing pre and post toielting with CGA  Skilled Therapeutic Interventions/Progress Updates:     Pt received in bed with no pain initially then unrated pain in wrist- seemed to be with supination. Repositioning for comfort  ADL: Pt completes ADL at overall MOD Level. Skilled interventions include: Edu re hooking bra first and keeping hooked to don like sports bra (MIN A), donned shirt with VC for hemi dressing technique, demo of reacher for AE use to thread BLE into underwear, MAX A to don pants for time management. Pt sit to stand with MIN lifting A but poor adherence to WB precautions despite cuing. Would benefit from beep shoe. Pt very anxious throughotu abut DC day. Continued education on POC and continuous evaluation of progress guiding DC day.  Pt left at end of session in bed with exit alarm on, call light in reach and all needs met   Therapy Documentation Precautions:  Precautions Precautions: Fall Required Braces or Orthoses: Splint/Cast Restrictions Weight Bearing Restrictions: Yes RUE Weight Bearing: Non weight bearing LLE Weight Bearing: Touchdown weight bearing Other Position/Activity Restrictions: ok to wb through R elbow on PFRW General:   Therapy/Group: Individual Therapy  Tonny Branch 12/27/2020, 5:47 AM

## 2020-12-27 NOTE — Progress Notes (Signed)
Physical Therapy Session Note  Patient Details  Name: Kathleen Daniels MRN: [id number] Date of Birth: [birth year]  Today's Date: 12/27/2020 PT Individual Time: 9480-1655 PT Individual Time Calculation (min): 38 min   Short Term Goals: Week 1:  PT Short Term Goal 1 (Week 1): Pt will perform sit <> supine w/min A PT Short Term Goal 2 (Week 1): Pt will ambulate 25' w/LRAD and min A with good adherence to WB precautions PT Short Term Goal 3 (Week 1): Pt will perform sit <>stand w/min A and LRAD with good adherence to WB precuations PT Short Term Goal 4 (Week 1): Pt will initiate stair training  Skilled Therapeutic Interventions/Progress Updates:  Pt was seen bedside in the am. Pt transferred supine to edge of bed, edge of bed to supine with min A and verbal cues. Pt performed sit to stand and stand pivot transfers with R PFRW and min to mod A with verbal cues. Pt ambulated about 3 feet x 2 with R PFRW and min to mod A with verbal cues. Following treatment pt returned to room and left sitting up in bed with all needs within reach.   Therapy Documentation Precautions:  Precautions Precautions: Fall Required Braces or Orthoses: Splint/Cast Restrictions Weight Bearing Restrictions: Yes RUE Weight Bearing: Non weight bearing LLE Weight Bearing: Touchdown weight bearing Other Position/Activity Restrictions: ok to wb through R elbow on PFRW General:  Pain: Pt c/o R forearm pain with mobility.     Therapy/Group: Individual Therapy  Dub Amis 12/27/2020, 3:31 PM

## 2020-12-28 ENCOUNTER — Inpatient Hospital Stay (HOSPITAL_COMMUNITY): Payer: Medicare Other

## 2020-12-28 MED ORDER — IOHEXOL 350 MG/ML SOLN
50.0000 mL | Freq: Once | INTRAVENOUS | Status: AC | PRN
  Administered 2020-12-28: 50 mL via INTRAVENOUS
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
## 2020-12-29 LAB — CBC
HCT: 30.6 % — ABNORMAL LOW (ref 36.0–46.0)
Hemoglobin: 10.1 g/dL — ABNORMAL LOW (ref 12.0–15.0)
MCH: 29.4 pg (ref 26.0–34.0)
MCHC: 33 g/dL (ref 30.0–36.0)
MCV: 89 fL (ref 80.0–100.0)
Platelets: 398 10*3/uL (ref 150–400)
RBC: 3.44 MIL/uL — ABNORMAL LOW (ref 3.87–5.11)
RDW: 12.9 % (ref 11.5–15.5)
WBC: 5.4 10*3/uL (ref 4.0–10.5)
nRBC: 0 % (ref 0.0–0.2)

## 2020-12-29 LAB — BASIC METABOLIC PANEL
Anion gap: 5 (ref 5–15)
BUN: 17 mg/dL (ref 8–23)
CO2: 25 mmol/L (ref 22–32)
Calcium: 8.8 mg/dL — ABNORMAL LOW (ref 8.9–10.3)
Chloride: 98 mmol/L (ref 98–111)
Creatinine, Ser: 0.64 mg/dL (ref 0.44–1.00)
GFR, Estimated: >60 mL/min (ref >60)
Glucose, Bld: 99 mg/dL (ref 70–99)
Potassium: 4.1 mmol/L (ref 3.5–5.1)
Sodium: 128 mmol/L — ABNORMAL LOW (ref 135–145)

## 2020-12-29 MED ORDER — TRAMADOL HCL 50 MG PO TABS
50.0000 mg | ORAL_TABLET | Freq: Four times a day (QID) | ORAL | Status: DC | PRN
  Administered 2021-01-02 – 2021-01-10 (×7): 50 mg via ORAL
  Filled 2020-12-29 (×8): qty 1
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
## 2020-12-29 NOTE — Progress Notes (Signed)
Physical Therapy Session Note  Patient Details  Name: Kathleen Daniels MRN: [id number] Date of Birth: 1945-10-09  Today's Date: 12/29/2020 PT Individual Time: 1120-1202; 7893-8101 PT Individual Time Calculation (min): 42 min and 70 min  Short Term Goals: Week 1:  PT Short Term Goal 1 (Week 1): Pt will perform sit <> supine w/min A PT Short Term Goal 2 (Week 1): Pt will ambulate 25' w/LRAD and min A with good adherence to WB precautions PT Short Term Goal 3 (Week 1): Pt will perform sit <>stand w/min A and LRAD with good adherence to WB precuations PT Short Term Goal 4 (Week 1): Pt will initiate stair training  Skilled Therapeutic Interventions/Progress Updates:  Session 1 Pt received seated in WC in room, daughter and son present. Pt reported 8/10 pain in RUE, nursing present administering pain meds and offered ice pack for pain relief. Emphasis of session on WC mobility as pt reported her arm was too painful to bear weight through for ambulation. Pt self-propelled 150' using LUE and RLE w/set-up assist and S*. Pt able to turn to L & R and perform 360 to both directions. Pt reported urgency to void, performed sit <>stand from [facility] to R PFRW w/min A and doffed pants in standing w/min A. Therapist moved [facility] behind pt in standning and pt voided continently and performed peri care w/set-up assist. Sit <>stand from [facility] to R PFRW w/min A and pt donned pants w/min A. Therapist moved WC behind pt and pt was left seated in WC, ice packs on L hip and R forearm, all needs in reach.   Session 2  Pt received seated in [facility], son present. Pt reported pain as 3/10 in R forearm and was agreeable to PT. Emphasis of session on improved efficiency w/gait and improved mood. Pt self-propelled from room to reflection fountain (>400')  w/max A, only propelling w/LUE. Pt became tearful once outside, telling therapist this was "her favorite time of year and she was missing it". Provided words of encouragement and emotional  support until pt was more emotionally stable.   Gait Training -Pt performed sit <>stands from [place] to R PFRW throughout session w/ min A for stabilization of AD and min trunk support -Pt practiced hopping in place w/R PFRW x15 w/CGA, mod verbal cues provided for technique and emphasis on pushing through RLE to hop rather than use BUEs to pull weight off ground. Pt able to clear foot off ground for 5 reps.  -Pt ambulated 62' w/R PFRW and min A for retropulsion correction outside on sidewalk. Pt required frequent standing rest breaks but demonstrated improved step clearance and swing-to gait pattern throughout session. Min verbal cues provided to incorporate knee flexion of RLE to increase step clearance.   Pt transported back to room w/total A due to fatigue. Sit <>stand pivot from WC to bed w/min A and R PFRW. Pt performed sit <>supine w/min A for LLE management and was left supine in bed, nursing present to administer meds, ice packs on L hip and R forearm, all needs in reach.   Therapy Documentation Precautions:  Precautions Precautions: Fall Required Braces or Orthoses: Splint/Cast Restrictions Weight Bearing Restrictions: Yes RUE Weight Bearing: Non weight bearing LLE Weight Bearing: Non weight bearing Other Position/Activity Restrictions: ok to wb through R elbow on PFRW   Therapy/Group: Individual Therapy Cruzita Lederer Tameisha Covell, PT, DPT  12/29/2020, 7:51 AM

## 2020-12-29 NOTE — Progress Notes (Signed)
Occupational Therapy Session Note  Patient Details  Name: Kathleen Daniels MRN: [id number] Date of Birth: 09/07/1945  Today's Date: 12/29/2020 OT Individual Time: 8101-7510 OT Individual Time Calculation (min): 65 min  and Today's Date: 12/29/2020 OT Missed Time: 10 Minutes Missed Time Reason: Unavailable (comment);Other (comment) (Pt in a meeting with care coordinator for home health aide)   Short Term Goals: Week 1:  OT Short Term Goal 1 (Week 1): Pt will be able to don pants with min A OT Short Term Goal 2 (Week 1): Pt will complete toilet transfers with CGA OT Short Term Goal 3 (Week 1): Pt will be able to adjust clothing pre and post toielting with CGA  Skilled Therapeutic Interventions/Progress Updates:    Pt received in room with her daughter present. The home care coordinator present and needed a few more minutes to discuss plans for her home health aides while daughter was here.   Daughter provided Measurements for bathroom, asked her to also take photos of the bathroom to determine if access to the shower and toilet is possible.   Pt did need to toilet. Donned TED hose for pt and R shoe. Pt used RW to stand from lower wc seat with mod A to avoid putting wt on L foot, in semi stand she transitioned her R forearm to pad of PFRW. She then used her R foot to swivel to the [facility] with min A. To assist pt, moved wc out of the way and BSC closer to her.  Pt able to void and self cleanse front perineal area. Mod A to manage clothing over hips. Pt is now more comfortable to release her L hand to pull on clothing. Pt then swiveled back to wc.  Pt stated the hard plastic seat is uncomfortable with her pelvic fractures. Obtained a padded BSC for pt to try next time.  In wc she feel pressure on L hip in sitting. Placed rolled towel under L side of wc cushion to shift pelvic pressure to her R.  Also provided pt with a half lap tray to support R arm as she said her arm feels so heavy without support.  Place drop in back support in wc as pt felt back of wc was hitting too low on her back.  Pt will need a BSC at discharge, the type of commode is to be determined.  Pt continues to feel emotional about her situation stating she feels fearful she will not be able to rebound completely. Spent time with pt encouraging her and sharing stories of other patient from past years that had similar injuries and returned to their very active lifestyles.  Pt was uncomfortable sitting in wc but agreed to stay up as her next PT session was in 25 min.  Pt in room with her daughter.   Therapy Documentation Precautions:  Precautions Precautions: Fall Required Braces or Orthoses: Splint/Cast Restrictions Weight Bearing Restrictions: Yes RUE Weight Bearing: Non weight bearing LLE Weight Bearing: Non weight bearing Other Position/Activity Restrictions: ok to wb through R elbow on PFRW  General OT Amount of Missed Time: 10 Minutes    Pain: pt continues to have significant pain in R forearm, using ice packs to reduce pain   ADL: ADL Eating: Set up Grooming: Setup Upper Body Bathing: Minimal assistance Where Assessed-Upper Body Bathing: Edge of bed Lower Body Bathing: Moderate assistance Where Assessed-Lower Body Bathing: Edge of bed Upper Body Dressing: Minimal assistance Lower Body Dressing: Maximal assistance Toileting: Maximal assistance Where Assessed-Toileting:  Bedside Commode Toilet Transfer: Minimal assistance Toilet Transfer Method: Stand pivot Toilet Transfer Equipment: Bedside commode    Therapy/Group: Individual Therapy  [place] 12/29/2020, 12:15 PM
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
## 2020-12-30 NOTE — Progress Notes (Addendum)
Occupational Therapy Session Note  Patient Details  Name: Kathleen Daniels MRN: [id number] Date of Birth: 08/25/1945  Today's Date: 12/30/2020 OT Individual Time: 0950-1100 OT Individual Time Calculation (min): 70 min    Short Term Goals: Week 1:  OT Short Term Goal 1 (Week 1): Pt will be able to don pants with min A OT Short Term Goal 2 (Week 1): Pt will complete toilet transfers with CGA OT Short Term Goal 3 (Week 1): Pt will be able to adjust clothing pre and post toielting with CGA  Skilled Therapeutic Interventions/Progress Updates:    Pt seen this session to problem solve bathroom transfers and plan for showering later in the week.  Completed stand pivot wc to toilet with PFRW with min, min -mod toileting to manage clothing, then used PFRW to swivel to tub bench. Practiced "dry run" tub bench transfers in walk in shower.  She did quite well today with transfer and she said she felt safe doing it with help.  Transitioned back to wc.  Pt discussed her R wrist pain and she does not feel her wrist feels stable in the splint and the top of the splint comes up too high to prevent her fingers from moving fully. Unwrapped splint to see if it could be adjusted but due to how it is conformed it can not be slid down.  Discussed with the CHT about making a custom splint to support her ulna fracture. Will message her ortho PA that we plan to do this if she agrees.   Pt requested to get in bed.  Min to transfer into bed and to get legs up onto bed.  Resting with all needs met and alarm set.    Therapy Documentation Precautions:  Precautions Precautions: Fall Required Braces or Orthoses: Splint/Cast Restrictions Weight Bearing Restrictions: Yes RUE Weight Bearing: Non weight bearing LLE Weight Bearing: Touchdown weight bearing Other Position/Activity Restrictions: ok to wb through R elbow on PFRW   Pain:   ADL: ADL Eating: Set up Grooming: Setup Upper Body Bathing: Minimal  assistance Where Assessed-Upper Body Bathing: Edge of bed Lower Body Bathing: Moderate assistance Where Assessed-Lower Body Bathing: Edge of bed Upper Body Dressing: Minimal assistance Lower Body Dressing: Maximal assistance Toileting: Moderate assistance Where Assessed-Toileting: Bedside Commode Toilet Transfer: Minimal assistance Toilet Transfer Method: Stand pivot Toilet Transfer Equipment: Bedside commode   Therapy/Group: Individual Therapy  [place] 12/30/2020, 12:36 PM

## 2020-12-30 NOTE — Progress Notes (Signed)
Physical Therapy Session Note  Patient Details  Name: Kathleen Daniels MRN: [id number] Date of Birth: 11-24-1945  Today's Date: 12/30/2020 PT Individual Time: 0802-0927; [id number] PT Individual Time Calculation (min): 85 min and 42 min  Short Term Goals: Week 1:  PT Short Term Goal 1 (Week 1): Pt will perform sit <> supine w/min A PT Short Term Goal 2 (Week 1): Pt will ambulate 25' w/LRAD and min A with good adherence to WB precautions PT Short Term Goal 3 (Week 1): Pt will perform sit <>stand w/min A and LRAD with good adherence to WB precuations PT Short Term Goal 4 (Week 1): Pt will initiate stair training  Skilled Therapeutic Interventions/Progress Updates:  Session 1 Pt received supine in bed, reported 4/10 pain in L hip and R forearm and was premedicated. Offered ice packs for pain relief throughout session. Pt requested to void, performed supine <>sit EOB w/CGA. Stand pivot from EOB to [facility] w/mod A and no AD (per pt request). Noted good adherence to weightbearing precautions.  Pt voided continently and performed peri care w/set-up assist. Sit <>stand pivot from [facility] to bed w/R PFRW and min A for AD management. Pt performed upper body dressing at EOB w/mod A and lower body w/max A. Sit <>stand from EOB w/min A and completed lower body dressing w/mod A. Stand pivot to [facility] w/R PFRW and Min A, pt refused to hop to chair and slid her foot instead. Educated pt on importance of lifting foot off ground during transfers for safety and improved functional mobility, pt verbalized agreement but stated she was more comfortable sliding foot. Pt performed oral hygiene seated at sink w/set-up assist.   Pre-gait training -Pt performed sit <>stand from [place] to R PFRW w/min A, noted good hand placement and adherence to precautions -Practiced forward and backward hopping x4 each direction w/PFRW and min A to improve pt's confidence and increase safety w/transfers. Mod verbal cues provided for backwards hop as pt  very fearful.   Pt performed the following exercises seated in WC for improved LLE ROM, pain modulation and strength: -LAQ x10 each side  -AAROM marches x15 each side  -Towel adductor squeezes, x30  -Hip abduction w/no resistance x30 each side   Pt educated on performing exercises listed above between therapy sessions for improved performance and strength. Pt was left seated in WC, ice packs on R forearm and L hip, all needs in reach.   Session 2 Pt received supine in bed, reported urgent need to void and pain as 6/10 in R forearm and L hip. Pt was premedicated, provided ice packs at end of session for pain relief. Pt performed supine <>sit w/CGA and sit <>stand pivot to [facility] w/min A and no AD, demonstrated good adherence to precautions and used therapist to bear weight through R elbow during transfer. Pt doffed pants w/mod A, voided continently and performed peri care w/set-up assist. Sit <>stand pivot from [facility] to [facility] w/min A and no AD using same technique as stated above, min A to donn pants. Pt transported to ortho gym w/total A for time management.   Car transfer -Sit <>stand from [facility] w/min A for stabilization of PFRW and pt ambulated 5' w/180 degree turn to sit in car. Provided mod verbal cues to hop w/RLE, but pt said "no" and slid her foot to complete the transfer instead. Pt performed car transfer w/min A for LLE management, educated pt on reclining seat in car to allow more room to bring LLE into  car, pt verbalized understanding. Pt able to get LLE out of car w/CGA and sit <>stand from car w/min A for PFRW management, min verbal cues for hand placement. Pt ambulated 5' back to [facility] by sliding her R foot on ground. When provided cues to hop, pt reported she would "do that tomorrow".   Pt transported back to room w/total A for time management and was left seated in WC in room, ice packs on L hip and R forearm, all needs in reach.   Therapy Documentation Precautions:  Precautions Precautions:  Fall Required Braces or Orthoses: Splint/Cast Restrictions Weight Bearing Restrictions: Yes RUE Weight Bearing: Non weight bearing LLE Weight Bearing: Touchdown weight bearing Other Position/Activity Restrictions: ok to wb through R elbow on PFRW   Therapy/Group: Individual Therapy Kathleen Daniels, PT, DPT  12/30/2020, 7:38 AM

## 2020-12-30 NOTE — Progress Notes (Signed)
Patient ID: Kathleen Daniels, female   DOB: June 11, 1945, 75 y.o.   MRN: [id number] Met with pt and spoke with both of her children-daughter-Liz and son-Bryan to inform team conference goals of supervision level and target discharge date of 11/4, which daughter would like to be 11/5 since trying to fly back and be here for the transition home. Checking with team and MD. Pt feels doing better but still has work to do and now here eye is bothering her.  MD ands team feel 11/5 will work and there are things to work on until then. Son does want to come in for education prior to discharge and will let this worker know when looks at calendar. Discussed equipment needs and son will be the point of contact for this. Will await team's recommendation for equipment and work on discharge needs.

## 2020-12-30 NOTE — Progress Notes (Signed)
Patient is A&O x 4  and able to make her needs known. Patient C/O right arm pain, and lower left back pain throughout the day. Patient has requested PRN Oxy 15 mg which has been effective. Call light and personal items all within reach. Safety devices in place.

## 2020-12-30 NOTE — Progress Notes (Signed)
Occupational Therapy Session Note  Patient Details  Name: Kathleen Daniels MRN: [id number] Date of Birth: 1945-11-05  Today's Date: 12/30/2020 OT Individual Time: 11:35-12:00 OT Individual Time Calculation (min): 25 min   Short Term Goals: Week 1:  OT Short Term Goal 1 (Week 1): Pt will be able to don pants with min A OT Short Term Goal 2 (Week 1): Pt will complete toilet transfers with CGA OT Short Term Goal 3 (Week 1): Pt will be able to adjust clothing pre and post toielting with CGA  Skilled Therapeutic Interventions/Progress Updates:    Pt received in bed with no c/o pain, reports cold packs have been helping and agreeable to OT. Would like to shower in the future when time allows. Requests to go outside, denies BADLs at this time.   Therapeutic activity Pt able to recall WB precautions and requires min A for moving L LE off EOB. Completes transfer EOB > wc using PFRW and CGA. Noted good adherence to WB precautions during transfer. Able to don sweater with min assist only for threading RUE d/t cast. Pt told therapists that fall was her favorite time of year, and she misses being outside. Provided emotional support throughout session and therapeutic listening.   Pt left at end of session in wc with exit belt alarm on, call light in reach and all needs met.   Therapy Documentation Precautions:  Precautions Precautions: Fall Required Braces or Orthoses: Splint/Cast Restrictions Weight Bearing Restrictions: Yes RUE Weight Bearing: Non weight bearing LLE Weight Bearing: Touchdown weight bearing Other Position/Activity Restrictions: ok to wb through R elbow on PFRW   Therapy/Group: Individual Therapy  Zailah Zagami 12/30/2020, 7:29 AM

## 2020-12-31 ENCOUNTER — Inpatient Hospital Stay (HOSPITAL_COMMUNITY): Payer: Medicare Other

## 2020-12-31 DIAGNOSIS — K5903 Drug induced constipation: Secondary | ICD-10-CM

## 2020-12-31 DIAGNOSIS — R3915 Urgency of urination: Secondary | ICD-10-CM

## 2020-12-31 MED ORDER — POLYETHYLENE GLYCOL 3350 17 G PO PACK
17.0000 g | PACK | Freq: Two times a day (BID) | ORAL | Status: DC
  Administered 2020-12-31 – 2021-01-05 (×9): 17 g via ORAL
  Filled 2020-12-31 (×19): qty 1

## 2020-12-31 MED ORDER — SENNOSIDES-DOCUSATE SODIUM 8.6-50 MG PO TABS
1.0000 | ORAL_TABLET | Freq: Every day | ORAL | Status: DC
  Administered 2020-12-31: 1 via ORAL
  Filled 2020-12-31: qty 1

## 2020-12-31 NOTE — Progress Notes (Addendum)
Physical Therapy Session Note  Patient Details  Name: Kathleen Daniels MRN: [id number] Date of Birth: 08-Nov-1945  Today's Date: 12/31/2020 PT Individual Time: 0800-0855 PT Individual Time Calculation (min): 55 min   Short Term Goals: Week 1:  PT Short Term Goal 1 (Week 1): Pt will perform sit <> supine w/min A PT Short Term Goal 2 (Week 1): Pt will ambulate 25' w/LRAD and min A with good adherence to WB precautions PT Short Term Goal 3 (Week 1): Pt will perform sit <>stand w/min A and LRAD with good adherence to WB precuations PT Short Term Goal 4 (Week 1): Pt will initiate stair training  Skilled Therapeutic Interventions/Progress Updates:   Received pt semi-reclined in bed, pt agreeable to PT treatment, and reported pain in L hip and R elbow (premedicated) but did not state pain level. Pt concerned with potential UTI due to urinary frequency and requested therapist inform MD - secure chat sent to Dr. Dagoberto Ligas. Session with emphasis on functional mobility/transfers, dressing, generalized strengthening, dynamic standing balance/coordination, toileting, ambulation, and improved activity tolerance. Pt transferred semi-reclined<>sitting EOB with supervision and use of bedrails. Pt requested to get dressed prior to leaving room and doffed dirty shirt with supervision, donned bra with min A to fasten clip, and donned pull over shirt with supervision. Pt pulled pants down to thighs via lateral leans and transferred sit<>stand with PFRW and CGA/min A (good adherence to LLE TDWB precautions) and required max A to doff pants. Returned to sitting and donned underwear and pants with max A. Sit<>stand with PFRW again and CGA/min A and able to pull pants/underwear up with min A. Stand<>pivot bed<>WC with PFRW and CGA with cues for RLE foot clearance. Pt requested to wash face and brush teeth - did so sitting in WC at sink with set up assist. Pt then reported urge to void and transferred WC<>bedside commode over  toilet with PFRW and min A. Pt required max A for clothing management and able to void and perform peri-care with supervision. Pt required mod A to pull pants/underwear over hips and transferred back into WC with min A but pivoting on RLE rather than lifting LE. Sit<>stand with PFRW and min A and pt ambulated 57ft with PFRW and min A with emphasis on "hopping" on RLE and clearing foot - pt with decreased foot clearance with fatigue. Pt then reported feeling nauseous and apologetic that she couldn't try anymore "hopping". Concluded session with pt sitting in WC, needs within reach, and seatbelt alarm on. Lap tray placed on WC to support RUE and provided pt with ice pack and emesis bag.   Therapy Documentation Precautions:  Precautions Precautions: Fall Required Braces or Orthoses: Splint/Cast Restrictions Weight Bearing Restrictions: Yes RUE Weight Bearing: Non weight bearing LLE Weight Bearing: Touchdown weight bearing Other Position/Activity Restrictions: ok to wb through R elbow on PFRW  Therapy/Group: Individual Therapy Alfonse Alpers PT, DPT   12/31/2020, 7:44 AM

## 2020-12-31 NOTE — Progress Notes (Signed)
Occupational Therapy Session Note  Patient Details  Name: Kathleen Daniels MRN: [id number] Date of Birth: 10-01-1945  Today's Date: 12/31/2020 OT Individual Time: 1130-1230 OT Individual Time Calculation (min): 60 min    Short Term Goals: Week 1:  OT Short Term Goal 1 (Week 1): Pt will be able to don pants with min A OT Short Term Goal 2 (Week 1): Pt will complete toilet transfers with CGA OT Short Term Goal 3 (Week 1): Pt will be able to adjust clothing pre and post toielting with CGA   Skilled Therapeutic Interventions/Progress Updates:    Pt seen in ortho gym seated in w/c with arm positioned on incline for support and comfort.  Pt c/o significant pain in left hip and moderate pain/tenderness in right wrist over posterior distal ulna.  Nurse made aware and medication administered prior to molding orthosis to address pain.  Custom fabrication of right wrist extension orthosis  with wrist placed in slight extension; completed using thermoplastic material with care to allow movement at distal palm crease to encourage full digital ROM and thumb opposition.  No changes to skin integrity noted after fabrication.  Pt reported increased comfort as well.  Educated pt on FULL TIME wear schedule and splint precautions.  Direct hand off to OT.  Therapy Documentation Precautions:  Precautions Precautions: Fall Required Braces or Orthoses: Splint/Cast Restrictions Weight Bearing Restrictions: Yes RUE Weight Bearing: Non weight bearing LLE Weight Bearing: Touchdown weight bearing Other Position/Activity Restrictions: ok to wb through R elbow on PFRW   Therapy/Group: Individual Therapy  Ezekiel Slocumb 12/31/2020, 2:56 PM
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
## 2020-12-31 NOTE — Progress Notes (Signed)
Occupational Therapy Session Note  Patient Details  Name: Kathleen Daniels MRN: [id number] Date of Birth: September 11, 1945  Today's Date: 12/31/2020 OT Individual Time: 1000-1030 OT Individual Time Calculation (min): 30 min    Short Term Goals: Week 1:  OT Short Term Goal 1 (Week 1): Pt will be able to don pants with min A OT Short Term Goal 2 (Week 1): Pt will complete toilet transfers with CGA OT Short Term Goal 3 (Week 1): Pt will be able to adjust clothing pre and post toielting with CGA  Skilled Therapeutic Interventions/Progress Updates:    Pt received in w/c stating she was feeling very nauseated.  Pt stated she wanted to work on her handwriting with R hand as she has a lot of paper work to sign and her signature is messing due to the splint. Informed pt that a new splint will be made for her this morning with the CHT and the new splint will likely allow for more finger ROM to hold a pencil. After the splint is made, she can work on handwriting in a later session today. Pt agreeable to working on washing her hair at the sink with the shampoo tray.  Pt used L hand to hold strap of tray in place while therapist washed and rinsed her hair.  Assisted pt with drying her hair.  Pt stated she felt better at the end of the session.  Pt resting in wc with all needs met.  Belt alarm on. At very end of session, pt stated she needed to toilet, asked pt to call her NT for assist.   Therapy Documentation Precautions:  Precautions Precautions: Fall Required Braces or Orthoses: Splint/Cast Restrictions Weight Bearing Restrictions: Yes RUE Weight Bearing: Non weight bearing LLE Weight Bearing: Touchdown weight bearing Other Position/Activity Restrictions: ok to wb through R elbow on PFRW  Vital Signs: Therapy Vitals Temp: 98.5 F (36.9 C) Temp Source: Oral Pulse Rate: 67 Resp: 18 BP: (!) 146/64 Patient Position (if appropriate): Lying Oxygen Therapy SpO2: 97 % O2 Device: Room Air Pain:   C/o pain in R forearm, a general 'ache" ADL: ADL Eating: Set up Grooming: Setup Upper Body Bathing: Minimal assistance Where Assessed-Upper Body Bathing: Edge of bed Lower Body Bathing: Moderate assistance Where Assessed-Lower Body Bathing: Edge of bed Upper Body Dressing: Minimal assistance Lower Body Dressing: Maximal assistance Toileting: Moderate assistance Where Assessed-Toileting: Bedside Commode Toilet Transfer: Minimal assistance Toilet Transfer Method: Stand pivot Toilet Transfer Equipment: Bedside commode  Therapy/Group: Individual Therapy  Luther 12/31/2020, 8:27 AM
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
## 2020-12-31 NOTE — Progress Notes (Signed)
Occupational Therapy Session Note  Patient Details  Name: Kathleen Daniels MRN: [id number] Date of Birth: 1945-07-28  Today's Date: 12/31/2020 OT Individual Time: 7519-8242 OT Individual Time Calculation (min): 60 min    Short Term Goals: Week 1:  OT Short Term Goal 1 (Week 1): Pt will be able to don pants with min A OT Short Term Goal 2 (Week 1): Pt will complete toilet transfers with CGA OT Short Term Goal 3 (Week 1): Pt will be able to adjust clothing pre and post toielting with CGA  Skilled Therapeutic Interventions/Progress Updates:    Pt missed 15 min skilled OT d/t meeting with chaplain services to improve coping/anxiety.  Pt received in w/c with unrated wrist pain. Rest and repositioning provided  ADL: Self feeding red foam handle seated in w/c with increased time to problem solve way to grasp spoon to not elecit pain in wrist. Pt able to eventually hold spoone overhand and bend elbow to bring food to mouth   Therapeutic exercise AROM 2x10: Flexor glides Opposition Palmar flexion Thumb abduction Shoulder flexion Int/ext rotation Shoulder abduction  Therapeutic activity Pt completes writing practice with large built up handle on pen. Pt reporting much more legible and less painful than trying to write previously. Pt able ot list grand children & ages and write what she did in therapy. Pt with mild increase in pain and OT eplained that more activity with the hand may make the wrist more sensitive and pt should rest and ice when back in the room. Pt verbalized understanding.  Pt left at end of session in bed with exit alarm on, call light in reach and all needs met   Therapy Documentation Precautions:  Precautions Precautions: Fall Required Braces or Orthoses: Splint/Cast Restrictions Weight Bearing Restrictions: Yes RUE Weight Bearing: Non weight bearing LLE Weight Bearing: Touchdown weight bearing Other Position/Activity Restrictions: ok to wb through R elbow on  PFRW General:   Therapy/Group: Individual Therapy  Tonny Branch 12/31/2020, 6:49 AM

## 2021-01-01 LAB — BASIC METABOLIC PANEL
Anion gap: 4 — ABNORMAL LOW (ref 5–15)
BUN: 14 mg/dL (ref 8–23)
CO2: 28 mmol/L (ref 22–32)
Calcium: 8.8 mg/dL — ABNORMAL LOW (ref 8.9–10.3)
Chloride: 100 mmol/L (ref 98–111)
Creatinine, Ser: 0.7 mg/dL (ref 0.44–1.00)
GFR, Estimated: >60 mL/min (ref >60)
Glucose, Bld: 96 mg/dL (ref 70–99)
Potassium: 3.9 mmol/L (ref 3.5–5.1)
Sodium: 132 mmol/L — ABNORMAL LOW (ref 135–145)

## 2021-01-01 LAB — URINALYSIS, ROUTINE W REFLEX MICROSCOPIC
Bilirubin Urine: NEGATIVE
Glucose, UA: NEGATIVE mg/dL
Hgb urine dipstick: NEGATIVE
Ketones, ur: NEGATIVE mg/dL
Leukocytes,Ua: NEGATIVE
Nitrite: NEGATIVE
Protein, ur: NEGATIVE mg/dL
Specific Gravity, Urine: 1.006 (ref 1.005–1.030)
pH: 7 (ref 5.0–8.0)

## 2021-01-01 MED ORDER — SENNOSIDES-DOCUSATE SODIUM 8.6-50 MG PO TABS
2.0000 | ORAL_TABLET | Freq: Every day | ORAL | Status: DC
  Administered 2021-01-01 – 2021-01-09 (×9): 2 via ORAL
  Filled 2021-01-01 (×10): qty 2

## 2021-01-01 NOTE — Progress Notes (Signed)
Occupational Therapy Session Note  Patient Details  Name: Kathleen Daniels MRN: [id number] Date of Birth: 1945/12/25  Today's Date: 01/01/2021 OT Individual Time: 1105-1200 OT Individual Time Calculation (min): 55 min    Short Term Goals: Week 1:  OT Short Term Goal 1 (Week 1): Pt will be able to don pants with min A OT Short Term Goal 2 (Week 1): Pt will complete toilet transfers with CGA OT Short Term Goal 3 (Week 1): Pt will be able to adjust clothing pre and post toielting with CGA  Skilled Therapeutic Interventions/Progress Updates:    Pt received in wc. She declined shower as she continues to feel nauseated. Informed her nurse.  Pt agreeable to working on R elb and shoulder ROM exercises and trying some handwriting to prepare her for signing papers. Pt taken to ADL kitchen to use table for large working area. Arm slides on towel for elb extension and shoulder prot/retr to maintain her functional AROM.  The edema in her fingers is now gone with the new splint and pt reports it is comfortable.   Did attempt handwriting but pt felt more fatigued in forearm and did not want to try at this time.   Pt and I spent a great deal of time reviewing her home bathroom measurements and photos.  Discussed possible logistical solutions to accessing her shower. (Removing BR door and using curtain, removing shower glass sliding doors and place a curtain, using a tub bench.) Will need to simulate a set up to practice how she will get to the toilet.  Pt taken back to room and resting in wc with all needs met. Continues to have waves of nausea.  RN aware.  Therapy Documentation Precautions:  Precautions Precautions: Fall Required Braces or Orthoses: Splint/Cast Restrictions Weight Bearing Restrictions: Yes RUE Weight Bearing: Non weight bearing LLE Weight Bearing: Touchdown weight bearing Other Position/Activity Restrictions: ok to wb through R elbow on PFRW  Pain: pt reports pain is "tolerable"    ADL: ADL Eating: Set up Grooming: Setup Upper Body Bathing: Minimal assistance Where Assessed-Upper Body Bathing: Edge of bed Lower Body Bathing: Moderate assistance Where Assessed-Lower Body Bathing: Edge of bed Upper Body Dressing: Minimal assistance Lower Body Dressing: Maximal assistance Toileting: Moderate assistance Where Assessed-Toileting: Bedside Commode Toilet Transfer: Minimal assistance Toilet Transfer Method: Stand pivot Toilet Transfer Equipment: Bedside commode   Therapy/Group: Individual Therapy  [place] 01/01/2021, 12:51 PM

## 2021-01-01 NOTE — Progress Notes (Signed)
Inpatient Rehab Admissions Coordinator:   Per Larene Beach with [facility], auth for CIR is [id number] with updates due to fax [phone] on 10/27.   Shann Medal, PT, DPT Admissions Coordinator [phone] 01/01/21  11:22 AM

## 2021-01-01 NOTE — Progress Notes (Signed)
Physical Therapy Session Note  Patient Details  Name: Kathleen Daniels MRN: [id number] Date of Birth: 07/27/1945  Today's Date: 01/01/2021 PT Individual Time: 8250-5397 PT Individual Time Calculation (min): 59 min   Short Term Goals: Week 1:  PT Short Term Goal 1 (Week 1): Pt will perform sit <> supine w/min A PT Short Term Goal 2 (Week 1): Pt will ambulate 25' w/LRAD and min A with good adherence to WB precautions PT Short Term Goal 3 (Week 1): Pt will perform sit <>stand w/min A and LRAD with good adherence to WB precuations PT Short Term Goal 4 (Week 1): Pt will initiate stair training  Skilled Therapeutic Interventions/Progress Updates:  Pt received seated in WC in room, reported 3/10 pain in R forearm and L hip, was premedicated. Offered ice packs at end of session for pain relief. Emphasis of session on pre-stair training and gait training for improved cardiovascular endurance and confidence w/hop-to pattern. Pt transported to ortho gym w/total A for time management. Pt performed sit <>stand from [facility] to [place] w/CGA and ambulated 25' utilizing hop-to gait pattern. Pt demonstrated improved step clearance, adherence to precautions, step cadence and management of PFRW compared to previous sessions.   Pre-stair training -Pt ascended/descended 1" step w/PFRW forward and backward, 2x10 w/CGA-min A for single LOB episode due to poor step clearance while descending step backward. Pt demonstrated decreased step clearance while ascending/descending step backward, provided min cues for increased BUE activation to push foot away from step. Pt educated on ascending steps at home using backward technique due to no handrails being available, pt verbalized understanding   Pt transported back to room w/total A 2/2 fatigue and was left seated in WC in room w/CSW, all needs in reach.   Therapy Documentation Precautions:  Precautions Precautions: Fall Required Braces or Orthoses:  Splint/Cast Restrictions Weight Bearing Restrictions: Yes RUE Weight Bearing: Non weight bearing LLE Weight Bearing: Touchdown weight bearing Other Position/Activity Restrictions: ok to wb through R elbow on PFRW   Therapy/Group: Individual Therapy [name], PT, DPT  01/01/2021, 7:48 AM

## 2021-01-01 NOTE — Progress Notes (Signed)
Physical Therapy Session Note  Patient Details  Name: Kathleen Daniels MRN: [id number] Date of Birth: 02-Feb-1946  Today's Date: 01/01/2021 PT Individual Time: 0800-0900 PT Individual Time Calculation (min): 60 min   Short Term Goals: Week 1:  PT Short Term Goal 1 (Week 1): Pt will perform sit <> supine w/min A PT Short Term Goal 2 (Week 1): Pt will ambulate 25' w/LRAD and min A with good adherence to WB precautions PT Short Term Goal 3 (Week 1): Pt will perform sit <>stand w/min A and LRAD with good adherence to WB precuations PT Short Term Goal 4 (Week 1): Pt will initiate stair training  Skilled Therapeutic Interventions/Progress Updates:     Patient in bed upon PT arrival. Patient alert and agreeable to PT session. Patient reported 3-4/10 pelvic pain during session, RN made aware. PT provided repositioning, rest breaks, and distraction as pain interventions throughout session.   Patient expressed concern about new urinary frequency and incontinence, reports MD aware and ordered urine culture. Educated on management of frequency with BSC by the bed rather than w/c<>bathroom to improve incontinence.   Therapeutic Activity: Bed Mobility: Patient performed supine to sit with supervision in a flat bed without use of bed rails to simulate home set-up. Patient sat EOB independently and doffed night shirt and donned bra and long sleep shirt with set-up assist. Transfers: Patient performed stand pivot bed>BSC and sit to/from stand x4 with CGA using R PFRW. Provided verbal cues for NWB for L lower extremity to ensure patient maintaining precautions and cues for placing elbow only on platform to prevent forearm weight bearing. Patient continent of bladder on BSC (urine sample collected and NT informed). Patient performed peri-care with set-up assist and elbow supported on PFRW and using L hand performing peri-care and CGA for standing balance in SLS. Lower body dressing performed with total A to don  brief and pull down/up pants.   Gait Training:  Patient ambulated 3 feet forwards and backwards and 20 feet x2 using R PFRW with CGA. Ambulated with hop-to gait pattern on R while maintaining NWB L LE and elbow weight bearing R UE. Provided verbal cues for use of back and arm muscles to lift R foot off the floor to swing forward rather then "jumping" to advance limb forward. Patient maintained precautions throughout.  Patient performed L DF stretch with gait belt/strap 2x1 min with min cues for technique sitting EOB.   Patient in w/c in the room at end of session with breaks locked, seat belt alarm set, and all needs within reach.   Therapy Documentation Precautions:  Precautions Precautions: Fall Required Braces or Orthoses: Splint/Cast Restrictions Weight Bearing Restrictions: Yes RUE Weight Bearing: Non weight bearing LLE Weight Bearing: Touchdown weight bearing Other Position/Activity Restrictions: ok to wb through R elbow on PFRW    Therapy/Group: Individual Therapy  Yuri Flener L Saria Haran PT, DPT  01/01/2021, 4:12 PM
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
## 2021-01-01 NOTE — Progress Notes (Signed)
Physical Therapy Session Note  Patient Details  Name: Kathleen Daniels MRN: [id number] Date of Birth: 1945/04/16  Attempted to see for unscheduled therapy - pt pleasantly deferring and requesting to rest in bed after a busy day of therapy. All needs met.  Alec Mcphee P Roland Lipke 01/01/2021, 1:39 PM

## 2021-01-02 LAB — URINE CULTURE: Culture: <10000 — AB

## 2021-01-02 MED ORDER — FLAVOXATE HCL 100 MG PO TABS
100.0000 mg | ORAL_TABLET | Freq: Three times a day (TID) | ORAL | Status: DC | PRN
  Administered 2021-01-02 (×2): 100 mg via ORAL
  Filled 2021-01-02 (×3): qty 1

## 2021-01-02 NOTE — Progress Notes (Signed)
   01/02/21 1432  Clinical Encounter Type  Visited With Other (Comment) (Call from Nurse, Pearline Cables)  Visit Type Initial  Referral From Nurse  Consult/Referral To Chaplain   The chaplain received a call from Pearline Cables, stating the patient had a document that needed to be notarized today--advised that if it is not an Advance Directive, we cannot notarize it. Per a review of chart notes, the patient was already advised of this by another Chaplain. This note was prepared by Jeanine Luz, M.Div..  For questions please contact by phone [phone].

## 2021-01-02 NOTE — Progress Notes (Signed)
Physical Therapy Weekly Progress Note  Patient Details  Name: Kathleen Daniels MRN: [id number] Date of Birth: June 26, 1945  Beginning of progress report period: December 26, 2020 End of progress report period: January 02, 2021  Today's Date: 01/02/2021 PT Individual Time: 7262-0355; 9741-6384 PT Individual Time Calculation (min): 58 min and 32 min  Patient has met 4 of 4 short term goals. Pt is able to perform bed mobility w/min A due to improved LLE strength and body mechanics. Pt is ambulating up to 68' w/PFRW using hop-to method w/CGA. Pt is progressing well in CIR and is performing sit <>stands w/CGA and PFRW. Family training has not occurred but will be scheduled prior to DC.   Patient continues to demonstrate the following deficits muscle weakness and pain and decreased balance strategies and therefore will continue to benefit from skilled PT intervention to increase functional independence with mobility.  Patient progressing toward long term goals..  Continue plan of care.  PT Short Term Goals Week 1:  PT Short Term Goal 1 (Week 1): Pt will perform sit <> supine w/min A PT Short Term Goal 1 - Progress (Week 1): Met PT Short Term Goal 2 (Week 1): Pt will ambulate 25' w/LRAD and min A with good adherence to WB precautions PT Short Term Goal 2 - Progress (Week 1): Met PT Short Term Goal 3 (Week 1): Pt will perform sit <>stand w/min A and LRAD with good adherence to WB precuations PT Short Term Goal 3 - Progress (Week 1): Met PT Short Term Goal 4 (Week 1): Pt will initiate stair training PT Short Term Goal 4 - Progress (Week 1): Met Week 2:  PT Short Term Goal 1 (Week 2): STG = LTG due to LOS  Skilled Therapeutic Interventions/Progress Updates:  Session 1 Pt received seated in WC in room, speaking to chaplain on phone regarding notary questions.Pt reported 6/10 pain in R forearm and L hip, reported she had declined her pain meds at lunch and regretted it. Nursing notified to administer  pain meds. Offered light stretching, positional changes and distraction throughout session for pain management.Pt transported to ortho gym w/total A for time management. Sit <>stand pivot from WC to mat w/PFRW and CGA, pt demonstrated adequate step clearance for 50% of transfer, min verbal cues for increased step clearance. Sit <>supine on mat w/min A for LLE management.   The following were performed in supine for improved LLE strength, ROM and pain relief: -Heel slides, x20 per side  -LE snow angels, x20 per side   -AAROM marches on LLE, x15   Pt became emotionally labile due to being unable to perform marches without assistance, provided emotional support allowed pt to vent. After a few minutes, pt able to calm down. Supine <>sit edge of mat w/CGA, pt required extra time to motor plan and perform transfer without use of bedrail. Sit <> stand pivot to WC w/PFRW and CGA. Pt transported back to room w/total A 2/2 fatigue and was left seated in WC in room, ice packs on R forearm and L hip, all needs in reach.    Session 2 Pt received seated in WC in room, reported 3/10 pain in R forearm and L hip and had ice packs on both. Emphasis of session on improved endurance and gait kinematics. Pt performed sit <>stands from [facility] throughout session w/CGA to [place]. Pt ambulated 25' and 50' w/PFRW and CGA, noted improved step clearance, step length and safe use of PFRW. Mod verbal cues to allow  L TDWB, as pt insists on holding her LLE completely off ground. Pt unwilling to try LLE TDWB due to fear. Min verbal and tactile cues provided for safe turn navigation, as pt turned too quickly and lost balance, requiring mod A for stabilization. In room, pt practiced turning around bed, sink and to door and back w/CGA, demonstrating proper technique. Pt was left seated in WC in room, all needs in reach, ice packs on R forearm and L hip.   Therapy Documentation Precautions:  Precautions Precautions: Fall Required Braces or  Orthoses: Splint/Cast Restrictions Weight Bearing Restrictions: Yes RUE Weight Bearing: Non weight bearing LLE Weight Bearing: Touchdown weight bearing Other Position/Activity Restrictions: ok to wb through R elbow on PFRW   Therapy/Group: Individual Therapy  Cruzita Lederer Sabrine Patchen, PT, DPT 01/02/2021, 7:51 AM

## 2021-01-02 NOTE — Progress Notes (Signed)
Occupational Therapy Weekly Progress Note  Patient Details  Name: Kathleen Daniels MRN: [id number] Date of Birth: 01/09/1946  Beginning of progress report period: December 26, 2020 End of progress report period: January 02, 2021  Today's Date: 01/02/2021 OT Individual Time: 7322-0254 OT Individual Time Calculation (min): 90 min    Patient has met 3 of 3 short term goals.  Pt is making excellent progress as her pain levels have decreased and she is getting more comfortable with figuring out how to move with [place] with her WB precautions.   Patient continues to demonstrate the following deficits: muscle weakness and muscle joint tightness and decreased standing balance and decreased balance strategies and therefore will continue to benefit from skilled OT intervention to enhance overall performance with BADL.  Patient progressing toward long term goals..  Continue plan of care.  OT Short Term Goals Week 1:  OT Short Term Goal 1 (Week 1): Pt will be able to don pants with min A OT Short Term Goal 1 - Progress (Week 1): Met OT Short Term Goal 2 (Week 1): Pt will complete toilet transfers with CGA OT Short Term Goal 2 - Progress (Week 1): Met OT Short Term Goal 3 (Week 1): Pt will be able to adjust clothing pre and post toielting with CGA OT Short Term Goal 3 - Progress (Week 1): Met Week 2:  OT Short Term Goal 1 (Week 2): STGs = LTGs  Skilled Therapeutic Interventions/Progress Updates:    Pt received in bed agreeable to therapy and a shower. Pt sat to EOB with S and sat for awhile and talked before standing to transfer as she said she was not feeling "great" but could participate.  Once up in standing to [place], pt demonstrated how she could now "hop" to move forward. Pt able to advance her R leg with walker all the way to the bathroom. Pt able to toilet with CGA then transferred to shower bench with CGA.  Due to R arm splint, she continues to need A with reaching under her L  arm to wash.  Pt  will need a long sponge to reach that area along with her feet.  Once dry, she did a stand pivot to her w/c with PFRW.  From wc pt needed A to apply deoderant under L arm, she then donned bra overhead prefastened and then her sweatshirt.  Pt used dressing stick to get underwear and pants over R foot after donning over L foot.  She needs assist with TED hose and socks.  Pt able to pull pants over hips with min A over R hip.   She then went to the fall festival party to interact with the therapy dog and pick out some snacks.   Her sister was present for part of the session and was able to observe her transfers and sit to stands.   Her sister transported pt back to her room as they had stopped in the hallway to place food orders with the food service tech.    Therapy Documentation Precautions:  Precautions Precautions: Fall Required Braces or Orthoses: Splint/Cast Restrictions Weight Bearing Restrictions: Yes RUE Weight Bearing: Non weight bearing LLE Weight Bearing: Touchdown weight bearing Other Position/Activity Restrictions: ok to wb through R elbow on PFRW   Pain: Pain Assessment Pain Score: 6  L hip - premedicated ADL: ADL Eating: Set up Grooming: Setup Upper Body Bathing: Minimal assistance Where Assessed-Upper Body Bathing: Shower Lower Body Bathing: Minimal assistance Where Assessed-Lower Body Bathing: Shower  Upper Body Dressing: Setup Where Assessed-Upper Body Dressing: Wheelchair Lower Body Dressing: Minimal assistance Where Assessed-Lower Body Dressing: Wheelchair Toileting: Contact guard Where Assessed-Toileting: Glass blower/designer: Therapist, music Method: Counselling psychologist: Raised toilet seat, Energy manager: Curator Method: Radiographer, therapeutic: Grab bars, Transfer tub bench     Therapy/Group: Individual Therapy  Bangs 01/02/2021, 12:42 PM
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
## 2021-01-03 MED ORDER — FLAVOXATE HCL 100 MG PO TABS
100.0000 mg | ORAL_TABLET | Freq: Three times a day (TID) | ORAL | Status: DC | PRN
  Filled 2021-01-03: qty 1

## 2021-01-03 MED ORDER — FLAVOXATE HCL 100 MG PO TABS
100.0000 mg | ORAL_TABLET | Freq: Three times a day (TID) | ORAL | Status: DC
  Administered 2021-01-03 – 2021-01-04 (×5): 100 mg via ORAL
  Filled 2021-01-03 (×6): qty 1

## 2021-01-03 MED ORDER — FLAVOXATE HCL 100 MG PO TABS: 100.0000 mg | ORAL_TABLET | Freq: Three times a day (TID) | ORAL | Status: DC
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
## 2021-01-04 DIAGNOSIS — N3289 Other specified disorders of bladder: Secondary | ICD-10-CM

## 2021-01-04 MED ORDER — FLAVOXATE HCL 100 MG PO TABS
100.0000 mg | ORAL_TABLET | Freq: Three times a day (TID) | ORAL | Status: DC | PRN
  Administered 2021-01-04: 100 mg via ORAL
  Filled 2021-01-04 (×2): qty 1

## 2021-01-04 MED ORDER — IMIPRAMINE HCL 10 MG PO TABS
10.0000 mg | ORAL_TABLET | Freq: Two times a day (BID) | ORAL | Status: DC
  Administered 2021-01-04 – 2021-01-06 (×4): 10 mg via ORAL
  Filled 2021-01-04 (×4): qty 1
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
## 2021-01-04 NOTE — Progress Notes (Signed)
Physical Therapy Session Note  Patient Details  Name: Kathleen Daniels MRN: [id number] Date of Birth: May 28, 1945  Today's Date: 01/04/2021 PT Individual Time: 1305-1400 PT Individual Time Calculation (min): 55 min   Short Term Goals: Week 2:  PT Short Term Goal 1 (Week 2): STG = LTG due to LOS  Skilled Therapeutic Interventions/Progress Updates:     Patient in w/c in the room upon PT arrival. Patient alert and agreeable to PT session. Patient reported 4/10 pelvic and R wrist pain during session, RN made aware. PT provided repositioning, rest breaks, and distraction as pain interventions throughout session.   Noted increased edema of R wrist and hand causing pitting around straps of R wrist splint. Loosened straps to improve fluid movement in hand and wrist. Educated on importance of elevation of R hand for edema control. Provided 1 large and 1 small pillow for elevation during remainder of session and patient intermittently placed elbow on arm rest to have her hand vertical against gravity. Edema improved throughout session.   Patient asking about location and number of fractures in her arm and pelvis. Reviewed x-rays with the patient indicating location of each fracture and impact of fracture on mobility. Educated on bone healing physiology and average healing time. Encouraged patient to discuss weight bearing progression and timing for fractures with orthopedic team, patient in agreement.   Patient expressed increased anxiety about going home next at the end of the week. Reports feeling safe in the hospital and unsafe in the community. States, "everything can change in the blink of an eye." Patient expressed increased anxiety from the trauma around her injury. Reports that she is scheduled to see the neuropsychologist tomorrow.    Discussed progression to increased independence with mobility, importance of exercise and activity to minimize pain and injury in the future. 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  Patient continues to be verbose throughout sessions. Encouraged patient to focus on practicing mobility during therapy sessions throughout the week to improve patient's confidence with her mobility at d/c, patient in agreement.    Patient in w/c in the room at end of session with breaks locked, chair alarm set, and all needs within reach.   Therapy Documentation Precautions:  Precautions Precautions: Fall Required Braces or Orthoses: Splint/Cast Restrictions Weight Bearing Restrictions: Yes RUE Weight Bearing: Non weight bearing LLE Weight Bearing: Weight bearing as tolerated Other Position/Activity Restrictions: ok to wb through R elbow on PFRW    Therapy/Group: Individual Therapy  Danaly Bari L Theseus Birnie PT, DPT  01/04/2021, 4:32 PM

## 2021-01-04 NOTE — Progress Notes (Signed)
Occupational Therapy Session Note  Patient Details  Name: Kathleen Daniels MRN: [id number] Date of Birth: Jan 18, 1946  Today's Date: 01/04/2021 OT Individual Time: 1100-1205 OT Individual Time Calculation (min): 65 min    Short Term Goals: Week 2:  OT Short Term Goal 1 (Week 2): STGs = LTGs  Skilled Therapeutic Interventions/Progress Updates:    Pt resting in w/c upon arrival. Medical emergency in another room initially prevented pt from leaving room to go to gym. Continued with discharge planning, DME, and home safety education. Practiced stand pivot transfers to recliner. Pt not comfortale in recliner and returned to w/c. Pt has tendency to swivel RLE/foot instead of hopping. Educated pt on importance of hopping to reduce torque on Rt knee when swiveling. Pt verbalized understanding. Near end of session, pt transported (for time mgmt) to ADL apartment and practiced furniture transfers with CGA. Pt returned to room and remained in w/c with all needs within reach. Belt alarm activated.  Therapy Documentation Precautions:  Precautions Precautions: Fall Required Braces or Orthoses: Splint/Cast Restrictions Weight Bearing Restrictions: Yes RUE Weight Bearing: Non weight bearing LLE Weight Bearing: Weight bearing as tolerated Other Position/Activity Restrictions: ok to wb through R elbow on PFRW   Pain: Pt denies pain at the beginning of session but reports at the end of the session that she is ready for more medications; repositioned and RN aware   Therapy/Group: Individual Therapy  Leroy Libman 01/04/2021, 2:27 PM

## 2021-01-05 DIAGNOSIS — F4311 Post-traumatic stress disorder, acute: Secondary | ICD-10-CM

## 2021-01-05 LAB — BASIC METABOLIC PANEL
Anion gap: 4 — ABNORMAL LOW (ref 5–15)
BUN: 12 mg/dL (ref 8–23)
CO2: 27 mmol/L (ref 22–32)
Calcium: 8.9 mg/dL (ref 8.9–10.3)
Chloride: 101 mmol/L (ref 98–111)
Creatinine, Ser: 0.69 mg/dL (ref 0.44–1.00)
GFR, Estimated: >60 mL/min (ref >60)
Glucose, Bld: 96 mg/dL (ref 70–99)
Potassium: 4.3 mmol/L (ref 3.5–5.1)
Sodium: 132 mmol/L — ABNORMAL LOW (ref 135–145)

## 2021-01-05 LAB — CBC
HCT: 30.9 % — ABNORMAL LOW (ref 36.0–46.0)
Hemoglobin: 10.6 g/dL — ABNORMAL LOW (ref 12.0–15.0)
MCH: 30.1 pg (ref 26.0–34.0)
MCHC: 34.3 g/dL (ref 30.0–36.0)
MCV: 87.8 fL (ref 80.0–100.0)
Platelets: 378 10*3/uL (ref 150–400)
RBC: 3.52 MIL/uL — ABNORMAL LOW (ref 3.87–5.11)
RDW: 12.8 % (ref 11.5–15.5)
WBC: 4.5 10*3/uL (ref 4.0–10.5)
nRBC: 0 % (ref 0.0–0.2)

## 2021-01-05 NOTE — Plan of Care (Signed)
  Problem: RH Stairs Goal: LTG Patient will ambulate up and down stairs w/assist (PT) Description: LTG: Patient will ambulate up and down # of stairs with assistance (PT) Flowsheets (Taken 01/05/2021 1601) LTG: Pt will ambulate up/down stairs assist needed:: Minimal Assistance - Patient > 75% LTG: Pt will  ambulate up and down number of stairs: 2 Note: Downgraded due to assistance needed to manage PFRW due to weightbearing restrictions
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
## 2021-01-05 NOTE — Progress Notes (Signed)
Occupational Therapy Session Note  Patient Details  Name: Kathleen Daniels MRN: [id number] Date of Birth: Dec 21, 1945  Today's Date: 01/05/2021 OT Individual Time: 1130-1200 and [id number]  OT Individual Time Calculation (min): 30 min and 42 min   Short Term Goals: Week 1:  OT Short Term Goal 1 (Week 1): Pt will be able to don pants with min A OT Short Term Goal 1 - Progress (Week 1): Met OT Short Term Goal 2 (Week 1): Pt will complete toilet transfers with CGA OT Short Term Goal 2 - Progress (Week 1): Met OT Short Term Goal 3 (Week 1): Pt will be able to adjust clothing pre and post toielting with CGA OT Short Term Goal 3 - Progress (Week 1): Met Week 2:  OT Short Term Goal 1 (Week 2): STGs = LTGs   Skilled Therapeutic Interventions/Progress Updates:    Session 1: Pt greeted at time of session sitting up in wheelchair, sleepy but agreeable to OT sesison. Pt only with pain/discomfort in R wrist with functional use at times but resolved with positional change. Pt transported to day room and focused on techniques to sign her name/improve hand writing with environmental modifications and built up handle, pt stating this improved comfort and does feel like she will be able to sign documents with this technique. Back in room, set up alarm on call bell in reach.  Session 2: Pt greeted at time of session semireclined in bed resting, agreeable to OT session. Did not report pain. Supine > sit Supervision and ambulated with PFRW CGA to/from bathroom. Min A for clothing management on R side/hip. W/c transport room > day room and assisted pt with settting up environment to sign personal documentation with built up pen to work on [employer] tasks. Once completed, transported back to room and set up alarm on call bell in reach.   Therapy Documentation Precautions:  Precautions Precautions: Fall Required Braces or Orthoses: Splint/Cast Restrictions Weight Bearing Restrictions: Yes RUE Weight Bearing: Non  weight bearing LLE Weight Bearing: Weight bearing as tolerated Other Position/Activity Restrictions: ok to wb through R elbow on PFRW    Therapy/Group: Individual Therapy  Viona Gilmore 01/05/2021, 7:13 AM

## 2021-01-05 NOTE — Progress Notes (Signed)
Physical Therapy Session Note  Patient Details  Name: Kathleen Daniels MRN: [id number] Date of Birth: 29-May-1945  Today's Date: 01/05/2021 PT Individual Time: 0932-1127 PT Individual Time Calculation (min): 115 min   Short Term Goals: Week 1:  PT Short Term Goal 1 (Week 1): Pt will perform sit <> supine w/min A PT Short Term Goal 1 - Progress (Week 1): Met PT Short Term Goal 2 (Week 1): Pt will ambulate 25' w/LRAD and min A with good adherence to WB precautions PT Short Term Goal 2 - Progress (Week 1): Met PT Short Term Goal 3 (Week 1): Pt will perform sit <>stand w/min A and LRAD with good adherence to WB precuations PT Short Term Goal 3 - Progress (Week 1): Met PT Short Term Goal 4 (Week 1): Pt will initiate stair training PT Short Term Goal 4 - Progress (Week 1): Met  Skilled Therapeutic Interventions/Progress Updates:  Pt received seated in WC in room, reported 4/10 pain in R forearm and L hip and was premedicated. Offered distraction, positional changes and ice packs for pain relief throughout session. Emphasis of session on stair training and transfers. Pt hyperverbal through first half of session, asking several questions regarding what to expect at DC and expressing concerns regarding her mobility. Educated pt on her next steps after CIR and provided reassurance regarding her functional mobility. Pt very anxious and continues to exhibit fear-avoidance behavior despite her progress in therapy.   Pt transported to ortho gym w/total A for time management. Sit <>stand from [facility] w/S* and pt ascended/descended 4" step x10 using backward approach w/CGA for improved confidence and practice prior to ascending a full curb. Noted significant step clearance and good adherence to precautions. Mod verbal cues for sequencing and diaphragmatic breathing, as pt very nervous throughout intervention.   Pt transported to main gym w/total A for time management. Sit <>stand from [facility] w/S* and PFRW and pt  ascended/descended a 5" curb x8 w/min A for PFRW management. Mod verbal cues to avoid grabbing PFRW w/R hand, sequencing and safety w/PFRW. Pt very anxious throughout intervention and required frequent rest breaks, provided lots of encouragement and knowledge of results to improve confidence. Pt seemingly very distracted today and unresponsive to encouragement.   Pt ambulated 150' w/PFRW and S* using hop-to gait pattern and good adherence to precautions. Noted improved gait speed and step clearance compared to previous sessions. Pt transported remainder of way to room w/total A 2/2 fatigue and was left seated in WC in room, ice packs on R forearm and L hip, all needs in reach.    Therapy Documentation Precautions:  Precautions Precautions: Fall Required Braces or Orthoses: Splint/Cast Restrictions Weight Bearing Restrictions: Yes RUE Weight Bearing: Non weight bearing LLE Weight Bearing: Weight bearing as tolerated Other Position/Activity Restrictions: ok to wb through R elbow on PFRW   Therapy/Group: Individual Therapy [name], PT, DPT  01/05/2021, 7:54 AM

## 2021-01-05 NOTE — Progress Notes (Signed)
Patient ID: Kathleen Daniels, female   DOB: 1945/03/30, 75 y.o.   MRN: [id number]  Met with pt to discuss education with daughter who is flying in [place]. Have scheduled for Friday at 1;00-3:00 with daughter So called to ask questions regarding equipment ordered and what about a tub seat or bench. Have messaged OT regarding recommendation and will order have placed the bulk of the order-hospital bed, platform rolling walker, 3 in 1 and wheelchair. Discussed transport home and informed him therapy team will educate daughter on car transfer. He reports he will need to come Sat or work out with neighbor due to daughter does not have a license since lives in [place] and uses public transport. He will work on this.

## 2021-01-06 MED ORDER — MIRABEGRON ER 25 MG PO TB24
25.0000 mg | ORAL_TABLET | Freq: Every day | ORAL | Status: DC
  Administered 2021-01-06: 25 mg via ORAL
  Filled 2021-01-06: qty 1
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
## 2021-01-06 NOTE — Progress Notes (Signed)
Occupational Therapy Session Note  Patient Details  Name: Kathleen Daniels MRN: [id number] Date of Birth: 1945/08/26  Today's Date: 01/06/2021 OT Individual Time: 7062-3762 and 8315-1761 OT Individual Time Calculation (min): 29 min and 54 min   Short Term Goals: Week 1:  OT Short Term Goal 1 (Week 1): Pt will be able to don pants with min A OT Short Term Goal 1 - Progress (Week 1): Met OT Short Term Goal 2 (Week 1): Pt will complete toilet transfers with CGA OT Short Term Goal 2 - Progress (Week 1): Met OT Short Term Goal 3 (Week 1): Pt will be able to adjust clothing pre and post toielting with CGA OT Short Term Goal 3 - Progress (Week 1): Met Week 2:  OT Short Term Goal 1 (Week 2): STGs = LTGs   Skilled Therapeutic Interventions/Progress Updates:    Session 1: Pt greeted at time of session sitting up in in wheelchair agreeable to OT session. No pain reported. Sit > stand at [place] and ambulated to/from w/c <> bathroom CGA and performed all toileting in same manner. W/c transport to ortho gym and focused on BUE ROM at shoulders in various planes/movements per pt request to prevent stiffness 2/2 non use of R hand. Mirror for feedback to promote symmetry. Back in room, alarm on call bell in reach.   Session 2: Pt greeted at time of session sitting up in wheelchair, sleepy but agreeable to OT session and did not report pain. Focus of session on IADL retraining in kitchen, discussing home layout, and primarily problem solving how to manage and reheat food items. [name] microwave is above stove, and discussed potentially getting smaller temporary microwave for counter top height. Pt able to retrieve 5/5 items from microwave height but would not be able to do bimanual tasks. Provided walker bag as well to assist with retrieving items from kitchen at home. Pt ambulating throughout kitchen with PFRW and CGA with good adherence to precautions. Needing to toilet urgently, hopping kitchen <> bathroom in ADL  apartment and toileting CGA. Wheelchair transport back to room and set up alarm on call bell in reach and personal staff present from [facility] agency for questions.   Therapy Documentation Precautions:  Precautions Precautions: Fall Required Braces or Orthoses: Splint/Cast Restrictions Weight Bearing Restrictions: Yes RUE Weight Bearing: Non weight bearing LLE Weight Bearing: Weight bearing as tolerated Other Position/Activity Restrictions: ok to wb through R elbow on PFRW    Therapy/Group: Individual Therapy  Viona Gilmore 01/06/2021, 7:22 AM
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
## 2021-01-07 MED ORDER — METHOCARBAMOL 500 MG PO TABS
500.0000 mg | ORAL_TABLET | Freq: Four times a day (QID) | ORAL | Status: DC | PRN
  Administered 2021-01-09 – 2021-01-10 (×2): 500 mg via ORAL
  Filled 2021-01-07 (×3): qty 1

## 2021-01-07 MED ORDER — MIRABEGRON ER 25 MG PO TB24
50.0000 mg | ORAL_TABLET | Freq: Every day | ORAL | Status: DC
  Administered 2021-01-07 – 2021-01-09 (×3): 50 mg via ORAL
  Filled 2021-01-07 (×3): qty 2

## 2021-01-07 NOTE — Discharge Instructions (Addendum)
Inpatient Rehab Discharge Instructions  Kathleen Daniels Discharge date and time: 01/09/21   Activities/Precautions/ Functional Status: Activity: no lifting, driving, or strenuous exercise till cleared by MD Diet: cardiac diet Wound Care: none needed   Functional status:  ___ No restrictions     ___ Walk up steps independently _X__ 24/7 supervision/assistance   ___ Walk up steps with assistance ___ Intermittent supervision/assistance  ___ Bathe/dress independently ___ Walk with walker     ___ Bathe/dress with assistance ___ Walk Independently    ___ Shower independently ___ Walk with assistance    ___ Shower with assistance _X__ No alcohol     ___ Return to work/school ________  Special Instructions: Wear splint on right wrist at all times. No weight on right wrist.  2. Touch down weight on left leg.    COMMUNITY REFERRALS UPON DISCHARGE:    Home Health:   PT & OT                 Agency: [facility] Equipment/Items Ordered:HOSPITAL BED, L-PLATFORM ROLLING WALKER, WHEELCHAIR AND TUB BENCH-BORROWING 3 IN 1                                                 Agency/Supplier:[facility]   [phone]    My questions have been answered and I understand these instructions. I will adhere to these goals and the provided educational materials after my discharge from the hospital.  Patient/Caregiver Signature _______________________________ Date __________  Clinician Signature _______________________________________ Date __________  Please bring this form and your medication list with you to all your follow-up doctor's appointments.

## 2021-01-07 NOTE — Progress Notes (Signed)
Occupational Therapy Session Note  Patient Details  Name: MAESYN FRISINGER MRN: [id number] Date of Birth: 1945/09/09  Today's Date: 01/07/2021 OT Individual Time: 0930-1030 OT Individual Time Calculation (min): 60 min    Short Term Goals: Week 1:  OT Short Term Goal 1 (Week 1): Pt will be able to don pants with min A OT Short Term Goal 1 - Progress (Week 1): Met OT Short Term Goal 2 (Week 1): Pt will complete toilet transfers with CGA OT Short Term Goal 2 - Progress (Week 1): Met OT Short Term Goal 3 (Week 1): Pt will be able to adjust clothing pre and post toielting with CGA OT Short Term Goal 3 - Progress (Week 1): Met Week 2:  OT Short Term Goal 1 (Week 2): STGs = LTGs  Skilled Therapeutic Interventions/Progress Updates:    Pt received in wc and discussed her home bathroom access.  Her son was able to remove the shower doors.  A tub bench would allow her to access the shower safely with her TDWB prec with her LLE.  Pt is very concerned about getting water on her hard wood floors.  Showed pt how to adjust shower curtain to avoid water spillage.  She is still very concerned about damaging her floors and only wants to use a shower chair. Explained that it is unlikely she could transfer to that safely UNTIL she becomes WBAT on LLE.  Suggested she ask her friend who is an OT who has inpt rehab experience to evaluate the situation.  Pt then wanted to change clothing. Using adaptive strategies and a reacher she doffed and donned clothing with S.  Used slip on shoes vs socks.    Spent quite a bit of time discussing her stress responses when see a car and I suggested she try to "desensitize" herself slowly and gradually by looking at photos of cars AND discussing her feelings.    Pt complained of end of splint sitting on her forearm in and awkward way, showed pt how to position arm with towel under, applied foam dressing padding to base of splint and removed stockinette as she needs a new one. Will  ask next OT to get a new one.   She worked on [employer] and elb AROM exercises of reaching overhead and extending elbows.    Pt resting in wc with all needs met.  Therapy Documentation Precautions:  Precautions Precautions: Fall Required Braces or Orthoses: Splint/Cast Restrictions Weight Bearing Restrictions: Yes RUE Weight Bearing: Non weight bearing LLE Weight Bearing: Touchdown weight bearing Other Position/Activity Restrictions: ok to wb through R elbow on PFRW   Pain: Pain Assessment Pain Scale: 0-10 Pain Score: 4  ADL: ADL Eating: Set up Grooming: Setup Upper Body Bathing: Minimal assistance Where Assessed-Upper Body Bathing: Shower Lower Body Bathing: Minimal assistance Where Assessed-Lower Body Bathing: Shower Upper Body Dressing: Setup Where Assessed-Upper Body Dressing: Wheelchair Lower Body Dressing: Minimal assistance Where Assessed-Lower Body Dressing: Wheelchair Toileting: Contact guard Where Assessed-Toileting: Glass blower/designer: Therapist, music Method: Counselling psychologist: Raised toilet seat, Energy manager: Curator Method: Radiographer, therapeutic: Grab bars, Transfer tub bench  Therapy/Group: Individual Therapy  [place] 01/07/2021, 12:59 PM

## 2021-01-07 NOTE — Progress Notes (Signed)
Occupational Therapy Session Note  Patient Details  Name: Kathleen Daniels MRN: [id number] Date of Birth: 04/25/1945  Today's Date: 01/07/2021 OT Individual Time: 1135-1200 OT Individual Time Calculation (min): 25 min    Short Term Goals: Week 1:  OT Short Term Goal 1 (Week 1): Pt will be able to don pants with min A OT Short Term Goal 1 - Progress (Week 1): Met OT Short Term Goal 2 (Week 1): Pt will complete toilet transfers with CGA OT Short Term Goal 2 - Progress (Week 1): Met OT Short Term Goal 3 (Week 1): Pt will be able to adjust clothing pre and post toielting with CGA OT Short Term Goal 3 - Progress (Week 1): Met  Skilled Therapeutic Interventions/Progress Updates:     Pt received in bed with minor pain in more CMC pain with opposition but no pain in wrists. Pt provided with stockinette for splint and applied under splint. Pt educated on walker tray and energy conservation techniques. Pt very excited and orders on [employer]. Reviewed AROM program for hands and provided level 1 theraputty, but educated if pt had any pain to not use with LUE.   Pt left at end of session in bed with exit alarm on, call light in reach and all needs met   Therapy Documentation Precautions:  Precautions Precautions: Fall Required Braces or Orthoses: Splint/Cast Restrictions Weight Bearing Restrictions: Yes RUE Weight Bearing: Non weight bearing LLE Weight Bearing: Weight bearing as tolerated Other Position/Activity Restrictions: ok to wb through R elbow on PFRW   Other Treatments:     Therapy/Group: Individual Therapy  Tonny Branch 01/07/2021, 6:55 AM

## 2021-01-07 NOTE — Progress Notes (Signed)
Physical Therapy Session Note  Patient Details  Name: Kathleen Daniels MRN: [id number] Date of Birth: Apr 09, 1945  Today's Date: 01/07/2021 PT Individual Time: 1503-1530 PT Individual Time Calculation (min): 27 min   Short Term Goals: Week 2:  PT Short Term Goal 1 (Week 2): STG = LTG due to LOS  Skilled Therapeutic Interventions/Progress Updates:    Patient received supine in bed, agreeable to PT. She reports 2/10 pain in L hip, premedicated. PT providing rest breaks, distractions and repositioning to assist with pain management. She was able to come sit edge of bed with supervision and transfer to wc via stand pivot with RPRW. PT transporting patient in wc to therapy gym for time management and energy conservation. NuStep completed using R LE, LUE x10 mins. Patient returning to room in wc. Ambulatory transfer to bed with RPRW and CGA. MinA to return supine. Bed alarm on, call light within reach.   Therapy Documentation Precautions:  Precautions Precautions: Fall Required Braces or Orthoses: Splint/Cast Restrictions Weight Bearing Restrictions: Yes RUE Weight Bearing: Non weight bearing LLE Weight Bearing: Weight bearing as tolerated Other Position/Activity Restrictions: ok to wb through R elbow on PFRW   Therapy/Group: Individual Therapy  Karoline Caldwell, PT, DPT, CBIS  01/07/2021, 7:46 AM

## 2021-01-07 NOTE — Progress Notes (Signed)
Physical Therapy Session Note  Patient Details  Name: Kathleen Daniels MRN: [id number] Date of Birth: 05-05-1945  Today's Date: 01/07/2021 PT Individual Time: 1350-1445 PT Individual Time Calculation (min): 55 min   Short Term Goals: Week 2:  PT Short Term Goal 1 (Week 2): STG = LTG due to LOS  Skilled Therapeutic Interventions/Progress Updates: Pt presented in w/c agreeable to therapy. Pt states premedicated and denies pain currently. Pt discussed some anxiety about d/c and knows has plan in place however also aware that CIR is a very controlled environment and will be a bit different upon d/c. Discussed some barriers and pt explained home set up. Pt states there will be some runners in hallways and large area rug in in den. Explained that may be safest to remove runners until more ambulatory with pt somewhat receptive. Pt then propelled with BLE and LUE to door of ADL apt and performed block practice hopping over threshold for floor transitions and Sit to stand from couch. Pt required x 1 verbal cue to push up from arm rest and then was able to perform all taks with CGA. Pt then transferred to day room for energy conservation and participated in Cybex Kinetron at 30cm/sec with RLE only for general conditioning. Pt then propelled with BLE and LUE back to room. Performed squat pivot transfer to bed but began transfer before ensuring w/c locked. Discussed importance of locking breaks for safety. Performed sit to supine with use of gait belt to lift LLE onto bed with supervision. Pt left in bed at end of session with bed alarm on, call bell within reach and needs met.      Therapy Documentation Precautions:  Precautions Precautions: Fall Required Braces or Orthoses: Splint/Cast Restrictions Weight Bearing Restrictions: Yes RUE Weight Bearing: Non weight bearing LLE Weight Bearing: Touchdown weight bearing Other Position/Activity Restrictions: ok to wb through R elbow on PFRW General:   Vital  Signs: Therapy Vitals Temp: 98.2 F (36.8 C) Pulse Rate: 77 Resp: 18 BP: 126/65 Patient Position (if appropriate): Sitting Oxygen Therapy SpO2: 100 % O2 Device: Room Air Pain: Pain Assessment Pain Scale: 0-10 Pain Score: 5  Mobility:   Locomotion :    Trunk/Postural Assessment :    Balance:   Exercises:   Other Treatments:      Therapy/Group: Individual Therapy  Kathleen Daniels 01/07/2021, 4:16 PM

## 2021-01-07 NOTE — Progress Notes (Signed)
Physical Therapy Session Note  Patient Details  Name: Kathleen Daniels MRN: [id number] Date of Birth: 1945-11-15  Today's Date: 01/07/2021 PT Individual Time: 1045-1130 PT Individual Time Calculation (min): 45 min   Short Term Goals: Week 2:  PT Short Term Goal 1 (Week 2): STG = LTG due to LOS  Skilled Therapeutic Interventions/Progress Updates: Pt presents sitting in w/c and agreeable to therapy.  Pt wheeled to main gym for time and energy conservation.  Pt performed (1) 5" step backwards w/ min A for control of platform walker 2/2 use of R hand.  Pt amb multiple trials w/ platform walker x 90' 90' and 50'.  Pt requires seated rest breaks and verbal cueing for increased use of UEs when amb to improve step length (pt remains NWB LLE).  Pt returned to room and handed off to OT.     Therapy Documentation Precautions:  Precautions Precautions: Fall Required Braces or Orthoses: Splint/Cast Restrictions Weight Bearing Restrictions: Yes RUE Weight Bearing: Non weight bearing LLE Weight Bearing: Touchdown weight bearing Other Position/Activity Restrictions: ok to wb through R elbow on PFRW General:   Vital Signs:   Pain:0/10 Pain Assessment Pain Scale: 0-10 Pain Score: 4  Pain Type: Acute pain Pain Location: Leg Pain Orientation: Right Pain Descriptors / Indicators: Aching Pain Frequency: Intermittent Pain Onset: With Activity Pain Intervention(s): Medication (See eMAR) Mobility:       Therapy/Group: Individual Therapy  [name] 01/07/2021, 11:29 AM
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
## 2021-01-08 NOTE — Progress Notes (Signed)
Physical Therapy Discharge Summary  Patient Details  Name: Kathleen Daniels MRN: [id number] Date of Birth: 1945/04/01    Patient has met 11 of 11 long term goals due to improved activity tolerance, improved balance, improved postural control, increased strength, decreased pain, ability to compensate for deficits, and improved coordination.  Patient to discharge at  [facility] and ambulatory w/PFRW  level Supervision.   Patient's care partner  available prn  to provide the necessary physical assistance at discharge and has participated in family training.  Recommendation:  Patient will benefit from ongoing skilled PT services in home health setting to continue to advance safe functional mobility, address ongoing impairments in global deconditioning, LLE weakness, and minimize fall risk.  Equipment: R PFRW, 16x16 WC hemi-height  Reasons for discharge: treatment goals met and discharge from hospital  Patient/family agrees with progress made and goals achieved: Yes  PT Discharge Precautions/Restrictions Precautions Precautions: Fall Required Braces or Orthoses: Splint/Cast Splint/Cast: L forearm/wrist Restrictions Weight Bearing Restrictions: Yes RUE Weight Bearing: Non weight bearing LLE Weight Bearing: Touchdown weight bearing Other Position/Activity Restrictions: ok to wb through R elbow on PFRW Pain Interference Pain Interference Pain Effect on Sleep: 1. Rarely or not at all Pain Interference with Therapy Activities: 1. Rarely or not at all Pain Interference with Day-to-Day Activities: 1. Rarely or not at all Vision/Perception  Vision - History Ability to See in Adequate Light: 0 Adequate Perception Perception: Within Functional Limits Praxis Praxis: Intact  Cognition Overall Cognitive Status: Within Functional Limits for tasks assessed Arousal/Alertness: Awake/alert Orientation Level: Oriented X4 Safety/Judgment: Appears intact Sensation Sensation Light Touch: Appears  Intact Coordination Gross Motor Movements are Fluid and Coordinated: No Coordination and Movement Description: Limited by pain, weightbearing restrictions Finger Nose Finger Test: [facility] Motor  Motor Motor: Within Functional Limits Motor - Discharge Observations: Limited by pain and weightbearing restrictions  Mobility Bed Mobility Bed Mobility: Rolling Left;Rolling Right;Supine to Sit;Sit to Supine;Sitting - Scoot to [employer] of Bed Rolling Right: Supervision/verbal cueing Rolling Left: Supervision/Verbal cueing Supine to Sit: Supervision/Verbal cueing Sitting - Scoot to Edge of Bed: Supervision/Verbal cueing Sit to Supine: Supervision/Verbal cueing Transfers Transfers: Sit to Stand;Stand to Sit;Stand Pivot Transfers Sit to Stand: Supervision/Verbal cueing Stand to Sit: Supervision/Verbal cueing Stand Pivot Transfers: Supervision/Verbal cueing Transfer (Assistive device): Right platform walker Locomotion  Gait Ambulation: Yes Gait Assistance: Supervision/Verbal cueing Gait Distance (Feet): 150 Feet Assistive device: Right platform walker Gait Gait: Yes Gait Pattern: Impaired Gait Pattern: Antalgic Gait velocity: decreased Stairs / Additional Locomotion Stairs: Yes Stairs Assistance: Minimal Assistance - Patient > 75% Stair Management Technique: Backwards;With walker Number of Stairs: 1 Height of Stairs: 5 Ramp: Supervision/Verbal cueing Curb: Supervision/Verbal cueing Wheelchair Mobility Wheelchair Mobility: Yes Wheelchair Assistance: Supervision/Verbal cueing Wheelchair Propulsion: Left upper extremity;Right lower extremity Wheelchair Parts Management: Needs assistance Distance: >150'  Trunk/Postural Assessment  Cervical Assessment Cervical Assessment: Within Functional Limits Thoracic Assessment Thoracic Assessment: Within Functional Limits Lumbar Assessment Lumbar Assessment: Within Functional Limits Postural Control Postural Control: Within Functional Limits   Balance Balance Balance Assessed: Yes Static Sitting Balance Static Sitting - Balance Support: Feet supported;Bilateral upper extremity supported Static Sitting - Level of Assistance: 7: Independent Dynamic Sitting Balance Dynamic Sitting - Balance Support: Feet supported;During functional activity Dynamic Sitting - Level of Assistance: 7: Independent Dynamic Sitting - Balance Activities: Lateral lean/weight shifting;Forward lean/weight shifting;Reaching for objects Static Standing Balance Static Standing - Balance Support: During functional activity;Left upper extremity supported Static Standing - Level of Assistance: 5: Stand by assistance Dynamic Standing Balance Dynamic  Standing - Balance Support: During functional activity;Left upper extremity supported Dynamic Standing - Level of Assistance: 5: Stand by assistance Dynamic Standing - Balance Activities: Forward lean/weight shifting;Reaching across midline;Reaching for objects;Lateral lean/weight shifting Extremity Assessment  RLE Assessment RLE Assessment: Exceptions to [facility] RLE Strength RLE Overall Strength: Within Functional Limits for tasks assessed Right Hip Flexion: 4+/5 Right Hip Extension: 4+/5 Right Hip ABduction: 4+/5 Right Hip ADduction: 4+/5 Right Knee Flexion: 4+/5 Right Knee Extension: 4+/5 Right Ankle Dorsiflexion: 4+/5 Right Ankle Plantar Flexion: 4+/5 LLE Assessment LLE Assessment: Exceptions to [facility] LLE Strength LLE Overall Strength: Due to pain;Deficits;Due to precautions Left Hip Flexion: 3-/5 Left Hip Extension: 3/5 Left Hip ABduction: 3-/5 Left Hip ADduction: 3-/5 Left Knee Flexion: 4/5 Left Knee Extension: 4/5 Left Ankle Dorsiflexion: 4+/5 Left Ankle Plantar Flexion: 4+/5   Janie Capp E Lalita Ebel, PT, DPT 01/09/2021, 7:54 AM

## 2021-01-08 NOTE — Progress Notes (Signed)
Physical Therapy Session Note  Patient Details  Name: Kathleen Daniels MRN: [id number] Date of Birth: 08/30/1945  Today's Date: 01/08/2021 PT Individual Time: 1030-1130 PT Individual Time Calculation (min): 60 min   Short Term Goals: Week 1:  PT Short Term Goal 1 (Week 1): Pt will perform sit <> supine w/min A PT Short Term Goal 1 - Progress (Week 1): Met PT Short Term Goal 2 (Week 1): Pt will ambulate 25' w/LRAD and min A with good adherence to WB precautions PT Short Term Goal 2 - Progress (Week 1): Met PT Short Term Goal 3 (Week 1): Pt will perform sit <>stand w/min A and LRAD with good adherence to WB precuations PT Short Term Goal 3 - Progress (Week 1): Met PT Short Term Goal 4 (Week 1): Pt will initiate stair training PT Short Term Goal 4 - Progress (Week 1): Met Week 2:  PT Short Term Goal 1 (Week 2): STG = LTG due to LOS  Skilled Therapeutic Interventions/Progress Updates:  Pt received seated in WC in room, reported 4/10 pain in L hip and requested pain meds, nursing notified. Offered positional changes, distraction and ice packs during session for pain relief. Emphasis of session on transfers and BLE strength. Pt self-propelled 100' to ortho gym w/LUE and RLE and S* for cardiovascular endurance and dynamic warmup. Pt performed sit <>stands throughout session w/S* and PFRW. Pt performed car transfer w/set-up assist and reclined seat, demonstrated good adherence to precautions. Reported slight increase in hip pain following transfer,provided ice pack for pain relief. Pt ambulated 20' from car to mat table w/S* and R PFRW using hop-to pattern. Sit <>supine on L side w/S* and the following exercises were performed supine on mat for improved LLE strength and light stretching: -Hooklying marches w/2s isometric hold, x10 per side  -Hooklying hip abduction, 2x10 per side -Heel slides on LLE, x20 -Snow angels on LLE, x20    Pt performed supine <>sit edge of mat and sit <>stand pivot to WC  w/PFRW and S*. Pt self-propelled 150' back to room w/set-up assist using aforementioned technique and was left seated in WC in room, nursing present, ice pack on L hip and all needs in reach.   Therapy Documentation Precautions:  Precautions Precautions: Fall Required Braces or Orthoses: Splint/Cast Restrictions Weight Bearing Restrictions: Yes RUE Weight Bearing: Non weight bearing LLE Weight Bearing: Touchdown weight bearing Other Position/Activity Restrictions: ok to wb through R elbow on PFRW   Therapy/Group: Individual Therapy Cruzita Lederer Neelam Tiggs, PT, DPT  01/08/2021, 7:42 AM

## 2021-01-08 NOTE — Progress Notes (Signed)
Occupational Therapy Session Note  Patient Details  Name: Kathleen Daniels MRN: [id number] Date of Birth: Jun 01, 1945  Today's Date: 01/08/2021 OT Individual Time: 4935-5217 OT Individual Time Calculation (min): 60 min    Short Term Goals: Week 2:  OT Short Term Goal 1 (Week 2): STGs = LTGs  Skilled Therapeutic Interventions/Progress Updates:    Pt agreeable to a shower today. Covered her R wrist splint with a plastic bag. Pt able to get up to stand to [place], ambulate with her R foot into bathroom and transferred onto tub bench with close S.  She doffed clothing and bathed using long sponge to reach her L arm and feet.  After drying off, donned shirt and bra independently then transferred back to wc outside bathroom to finish dressing.  Donned clothing using AE to help get over her feet.  Pt has slip on shoes she prefers to wear.  Yesterday pt convinced she did not want a tub bench due to worry about her floors getting wet.  We had more time today to discuss at length how the process could work and what she can do to protect the floors from water damage.  Drew a diagram of bathroom and how she would get to the toilet then swivel onto bench to get into shower.  She was able to get the glass doors removed.   In next OT sessions, she can practice sit to stand to toilet by reaching back to seat as she does not have a L side grab bar she could use.    Pt resting in wc with all needs met.   Therapy Documentation Precautions:  Precautions Precautions: Fall Required Braces or Orthoses: Splint/Cast Restrictions Weight Bearing Restrictions: Yes RUE Weight Bearing: Non weight bearing LLE Weight Bearing: Touchdown weight bearing Other Position/Activity Restrictions: ok to wb through R elbow on PFRW   Pain: Pain Assessment Pain Scale: 0-10 Pain Score: 4  - premedicated  ADL: ADL Eating: Independent Grooming: Independent Upper Body Bathing: Supervision/safety Where Assessed-Upper Body Bathing:  Shower Lower Body Bathing: Supervision/safety Where Assessed-Lower Body Bathing: Shower Upper Body Dressing: Setup Where Assessed-Upper Body Dressing: Wheelchair Lower Body Dressing: Supervision/safety Where Assessed-Lower Body Dressing: Wheelchair Toileting: Supervision/safety Where Assessed-Toileting: Glass blower/designer: Close supervision Armed forces technical officer Method: Counselling psychologist: Raised toilet seat, Energy manager: Close supervision Social research officer, government Method: Radiographer, therapeutic: Grab bars, Transfer tub bench   Therapy/Group: Individual Therapy  [place] 01/08/2021, 10:46 AM
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
## 2021-01-08 NOTE — Progress Notes (Signed)
Inpatient Rehabilitation Discharge Medication Review by a Pharmacist  A complete drug regimen review was completed for this patient to identify any potential clinically significant medication issues.  High Risk Drug Classes Is patient taking? Indication by Medication  Antipsychotic No   Anticoagulant No   Antibiotic No   Opioid Yes Tramadol for pain  Antiplatelet No   Hypoglycemics/insulin No   Vasoactive Medication No   Chemotherapy No   Other Yes Finasteride, myrbetriq for urinary frequency  Wellbturin XL for mood stablization     Type of Medication Issue Identified Description of Issue Recommendation(s)  Drug Interaction(s) (clinically significant)     Duplicate Therapy     Allergy     No Medication Administration End Date     Incorrect Dose     Additional Drug Therapy Needed     Significant med changes from prior encounter (inform family/care partners about these prior to discharge).    Other       Clinically significant medication issues were identified that warrant physician communication and completion of prescribed/recommended actions by midnight of the next day:  No  Pharmacist comments: No issues  Time spent performing this drug regimen review (minutes):  20 minutes   Tad Moore 01/08/2021 9:55 AM

## 2021-01-08 NOTE — Progress Notes (Signed)
Occupational Therapy Session Note  Patient Details  Name: Kathleen Daniels MRN: [id number] Date of Birth: May 21, 1945  Today's Date: 01/08/2021 OT Individual Time: 7530-0511 OT Individual Time Calculation (min): 28 min   OT Individual Time: 1300-1408 OT Individual Time Calculation (min): 65 min   Short Term Goals: Week 2:  OT Short Term Goal 1 (Week 2): STGs = LTGs  Skilled Therapeutic Interventions/Progress Updates:  Session 1: Patient met seated in wc in agreement with OT treatment session. 4/10 pain reported at rest and with activity in L hip. Ice applied at conclusion of session. OT treatment session with focus on simulated walk-in shower transfer via [place] with adherence to RUE/LLE weightbearing precautions. Plan to further practice at time of afternoon treatment session give time constraints of this session. Session concluded with patient seated in wc with call bell within reach and all needs met.   Session 2: Patient met seated in wc in agreement with OT treatment session with focus on self-care re-education and community reintegration. Total A for wc transport to and from ADL apartment, outdoor area near hospital atrium and patient's room for time management. In ADL apartment DME set up to simulate home environment with patient completing walk-in shower transfer x2 with Min guard to supervision A and good adherence to WB precautions. OT/patient talked through navigating RW through tight space in bathroom. Patient able to complete 180 degree turn with RW and supervision A to simulate toilet transfer in home bathroom. Reports family has taken bathroom door off hinges to allow more space for navigation upon return home. Seated BLE stretching to decrease tightness. In outdoor area, patient completed functional mobility ~75-128ft x2 trials with PFRW and good adherence to TDWB LLE (patient prefers to hold LLE off ground entirely despite cues). Upon return to room, patient able to transfer from  EOB to supine with Mod I. Session concluded with patient lying supine in bed with call bell within reach and all needs met. Ice again applied to L hip.   Therapy Documentation Precautions:  Precautions Precautions: Fall Required Braces or Orthoses: Splint/Cast Restrictions Weight Bearing Restrictions: Yes RUE Weight Bearing: Non weight bearing LLE Weight Bearing: Touchdown weight bearing Other Position/Activity Restrictions: ok to wb through R elbow on PFRW General:    Therapy/Group: Individual Therapy  Kathleen Daniels 01/08/2021, 6:55 AM

## 2021-01-09 LAB — BASIC METABOLIC PANEL
Anion gap: 4 — ABNORMAL LOW (ref 5–15)
BUN: 13 mg/dL (ref 8–23)
CO2: 27 mmol/L (ref 22–32)
Calcium: 9 mg/dL (ref 8.9–10.3)
Chloride: 102 mmol/L (ref 98–111)
Creatinine, Ser: 0.58 mg/dL (ref 0.44–1.00)
GFR, Estimated: >60 mL/min (ref >60)
Glucose, Bld: 82 mg/dL (ref 70–99)
Potassium: 3.8 mmol/L (ref 3.5–5.1)
Sodium: 133 mmol/L — ABNORMAL LOW (ref 135–145)

## 2021-01-09 MED ORDER — MIRABEGRON ER 50 MG PO TB24: 50.0000 mg | ORAL_TABLET | Freq: Every day | ORAL | 1 refills | Status: DC

## 2021-01-09 MED ORDER — ACETAMINOPHEN 325 MG PO TABS: 650.0000 mg | ORAL_TABLET | Freq: Four times a day (QID) | ORAL | 0 refills | Status: DC

## 2021-01-09 MED ORDER — SENNOSIDES-DOCUSATE SODIUM 8.6-50 MG PO TABS: 2.0000 | ORAL_TABLET | Freq: Every day | ORAL | 0 refills | Status: DC

## 2021-01-09 MED ORDER — METHOCARBAMOL 500 MG PO TABS: 500.0000 mg | ORAL_TABLET | Freq: Four times a day (QID) | ORAL | 0 refills | Status: DC | PRN

## 2021-01-09 MED ORDER — MIRABEGRON ER 25 MG PO TB24
50.0000 mg | ORAL_TABLET | Freq: Every day | ORAL | Status: DC
  Administered 2021-01-09: 50 mg via ORAL
  Filled 2021-01-09: qty 2

## 2021-01-09 MED ORDER — OXYCODONE HCL 10 MG PO TABS: 10.0000 mg | ORAL_TABLET | Freq: Four times a day (QID) | ORAL | 0 refills | Status: AC | PRN

## 2021-01-09 MED ORDER — CAMPHOR-MENTHOL 0.5-0.5 % EX LOTN: TOPICAL_LOTION | CUTANEOUS | 0 refills | Status: DC | PRN

## 2021-01-09 MED ORDER — ONDANSETRON HCL 4 MG PO TABS: 2.0000 mg | ORAL_TABLET | Freq: Three times a day (TID) | ORAL | 1 refills | Status: DC

## 2021-01-09 MED ORDER — TRAMADOL HCL 50 MG PO TABS: 50.0000 mg | ORAL_TABLET | Freq: Four times a day (QID) | ORAL | 0 refills | Status: DC | PRN

## 2021-01-09 MED ORDER — PROSOURCE PLUS PO LIQD: 30.0000 mL | Freq: Two times a day (BID) | ORAL | 0 refills | Status: DC
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
## 2021-01-09 NOTE — Progress Notes (Signed)
Inpatient Rehabilitation Care Coordinator Discharge Note DC SAT 11/5  Patient Details  Name: Kathleen Daniels MRN: [id number] Date of Birth: [birth year]   Discharge location: HOME WITH DAUGHTER WHO IS HERE FROM [place] TO MAKE TRANSITION [place] WITH HIRED CAREGIVER 24/7 FOR THE FIRST WEEK  Length of Stay: 15 DAYS  Discharge activity level: SUPERVISION OVERALL  Home/community participation: ACITVE  Patient response VO:HYWVPX Literacy - How often do you need to have someone help you when you read instructions, pamphlets, or other written material from your doctor or pharmacy?: Never  Patient response TG:GYIRSW Isolation - How often do you feel lonely or isolated from those around you?: Rarely  Services provided included: MD, RD, PT, OT, RN, CM, TR, Pharmacy, Neuropsych, SW  Financial Services:  Charity fundraiser Utilized: Regulatory affairs officer MEDICARE  Choices offered to/list presented to: PT AND DAUGHTER  Follow-up services arranged:  Home Health, DME, Patient/Family has no preference for HH/DME agencies [place]: [place]    DME : [facility] B[facility], L-PLATFORM ROLLING WALKER, WHEELCHAIR, [place] 3 IN 1    Patient response to transportation need: Is the patient able to respond to transportation needs?: Yes In the past 12 months, has lack of transportation kept you from medical appointments or from getting medications?: No In the past 12 months, has lack of transportation kept you from meetings, work, or from getting things needed for daily living?: No    Comments (or additional information):DAUGHTER AND [name] SISTER WERE HERE FOR EDUCATION ON Friday 11/4 AND ALL COMFORTABLE WITH DISCHARGE HOME. WILL HAVE 24/7 CARE BETWEEN HIRED ASSIST, FAMILY AND FRIENDS  Patient/Family verbalized understanding of follow-up arrangements:  Yes  Individual responsible for coordination of the follow-up plan: SELF ANDS Kathleen Daniels  [phone]  Confirmed correct DME delivered: Kathleen Daniels 01/09/2021    Kathleen Daniels

## 2021-01-09 NOTE — Plan of Care (Signed)
  Problem: RH Balance Goal: LTG Patient will maintain dynamic sitting balance (PT) Description: LTG:  Patient will maintain dynamic sitting balance with assistance during mobility activities (PT) Outcome: Completed/Met Goal: LTG Patient will maintain dynamic standing balance (PT) Description: LTG:  Patient will maintain dynamic standing balance with assistance during mobility activities (PT) Outcome: Completed/Met   Problem: Sit to Stand Goal: LTG:  Patient will perform sit to stand with assistance level (PT) Description: LTG:  Patient will perform sit to stand with assistance level (PT) Outcome: Completed/Met   Problem: RH Bed Mobility Goal: LTG Patient will perform bed mobility with assist (PT) Description: LTG: Patient will perform bed mobility with assistance, with/without cues (PT). Outcome: Completed/Met   Problem: RH Bed to Chair Transfers Goal: LTG Patient will perform bed/chair transfers w/assist (PT) Description: LTG: Patient will perform bed to chair transfers with assistance (PT). Outcome: Completed/Met   Problem: RH Car Transfers Goal: LTG Patient will perform car transfers with assist (PT) Description: LTG: Patient will perform car transfers with assistance (PT). Outcome: Completed/Met   Problem: RH Ambulation Goal: LTG Patient will ambulate in home environment (PT) Description: LTG: Patient will ambulate in home environment, # of feet with assistance (PT). Outcome: Completed/Met   Problem: RH Wheelchair Mobility Goal: LTG Patient will propel w/c in controlled environment (PT) Description: LTG: Patient will propel wheelchair in controlled environment, # of feet with assist (PT) Outcome: Completed/Met Goal: LTG Patient will propel w/c in home environment (PT) Description: LTG: Patient will propel wheelchair in home environment, # of feet with assistance (PT). Outcome: Completed/Met   Problem: RH Stairs Goal: LTG Patient will ambulate up and down stairs w/assist  (PT) Description: LTG: Patient will ambulate up and down # of stairs with assistance (PT) Outcome: Completed/Met
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
## 2021-01-09 NOTE — Progress Notes (Signed)
Occupational Therapy Discharge Summary  Patient Details  Name: Kathleen Daniels MRN: [id number] Date of Birth: 03/22/1945   Patient has met 7 of 7 long term goals due to improved activity tolerance, improved balance, and ability to compensate for deficits.  Patient to discharge at overall Supervision level.  Patient's care partner is independent to provide the necessary physical assistance at discharge.    Family education completed with daughter. HEP provided. Reviewed safety recommendations for home, to practice shower transfer with [facility] FIRST before actually taking a shower with the home health aides.   Reasons goals not met: n/a  Recommendation:  Patient will benefit from ongoing skilled OT services in home health setting to continue to advance functional skills in the area of BADL and iADL.  Equipment: Radio broadcast assistant, BSC  Reasons for discharge: treatment goals met  Patient/family agrees with progress made and goals achieved: Yes  OT Discharge Precautions/Restrictions  Precautions Precautions: Fall Required Braces or Orthoses: Splint/Cast Splint/Cast: L forearm/wrist Restrictions Weight Bearing Restrictions: Yes RUE Weight Bearing: Non weight bearing LLE Weight Bearing: Touchdown weight bearing Other Position/Activity Restrictions: ok to wb through R elbow on PFRW    ADL ADL Eating: Independent Grooming: Independent Upper Body Bathing: Supervision/safety Where Assessed-Upper Body Bathing: Shower Lower Body Bathing: Supervision/safety Where Assessed-Lower Body Bathing: Shower Upper Body Dressing: Setup Where Assessed-Upper Body Dressing: Wheelchair Lower Body Dressing: Supervision/safety Where Assessed-Lower Body Dressing: Wheelchair Toileting: Supervision/safety Where Assessed-Toileting: Glass blower/designer: Close supervision Toilet Transfer Method: Counselling psychologist: Raised toilet seat, Energy manager: Close  supervision Social research officer, government Method: Radiographer, therapeutic: Grab bars, Gaffer Baseline Vision/History: 1 Wears glasses Patient Visual Report: No change from baseline Vision Assessment?: No apparent visual deficits Perception  Perception: Within Functional Limits Praxis Praxis: Intact Cognition Overall Cognitive Status: Within Functional Limits for tasks assessed Arousal/Alertness: Awake/alert Orientation Level: Oriented X4 Year: 2022 Month: November Day of Week: Correct Memory: Appears intact Immediate Memory Recall: Sock;Blue;Bed Memory Recall Sock: Without Cue Memory Recall Blue: Without Cue Memory Recall Bed: Without Cue Awareness: Appears intact Problem Solving: Appears intact Safety/Judgment: Appears intact Sensation Sensation Light Touch: Appears Intact Coordination Gross Motor Movements are Fluid and Coordinated: No Coordination and Movement Description: Limited by pain, weightbearing restrictions Finger Nose Finger Test: [facility] Motor  Motor Motor: Within Functional Limits Motor - Discharge Observations: Limited by pain and weightbearing restrictions Mobility  Bed Mobility Bed Mobility: Rolling Left;Rolling Right;Supine to Sit;Sit to Supine;Sitting - Scoot to [employer] of Bed Rolling Right: Supervision/verbal cueing Rolling Left: Supervision/Verbal cueing Supine to Sit: Supervision/Verbal cueing Sitting - Scoot to Edge of Bed: Supervision/Verbal cueing Sit to Supine: Supervision/Verbal cueing Transfers Sit to Stand: Supervision/Verbal cueing Stand to Sit: Supervision/Verbal cueing  Trunk/Postural Assessment  Cervical Assessment Cervical Assessment: Within Functional Limits Thoracic Assessment Thoracic Assessment: Within Functional Limits Lumbar Assessment Lumbar Assessment: Within Functional Limits Postural Control Postural Control: Within Functional Limits  Balance Balance Balance Assessed: Yes Static Sitting  Balance Static Sitting - Balance Support: Feet supported;Bilateral upper extremity supported Static Sitting - Level of Assistance: 7: Independent Dynamic Sitting Balance Dynamic Sitting - Balance Support: Feet supported;During functional activity Dynamic Sitting - Level of Assistance: 7: Independent Dynamic Sitting - Balance Activities: Lateral lean/weight shifting;Forward lean/weight shifting;Reaching for objects Static Standing Balance Static Standing - Balance Support: During functional activity;Left upper extremity supported Static Standing - Level of Assistance: 5: Stand by assistance Dynamic Standing Balance Dynamic Standing - Balance Support: During functional activity;Left upper extremity  supported Dynamic Standing - Level of Assistance: 5: Stand by assistance Dynamic Standing - Balance Activities: Forward lean/weight shifting;Reaching across midline;Reaching for objects;Lateral lean/weight shifting Extremity/Trunk Assessment RUE Assessment Active Range of Motion (AROM) Comments: WFL shoulder, elbow, wrist in wrist cock up splint, full finger AROM General Strength Comments: WFL shoulder LUE Assessment LUE Assessment: Within Functional Limits General Strength Comments: premorbid issues with RTC, can only abd sh to 90 but has full sh flexion AROM.  Strength WFL within ROM limitiations.   [place] 01/09/2021, 10:40 AM
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
## 2021-01-09 NOTE — Progress Notes (Signed)
Occupational Therapy Session Note  Patient Details  Name: Kathleen Daniels MRN: [id number] Date of Birth: [birth year]  Today's Date: 01/09/2021 OT Individual Time: 1300-1359 OT Individual Time Calculation (min): 59 min    Short Term Goals: Week 2:  OT Short Term Goal 1 (Week 2): STGs = LTGs  Skilled Therapeutic Interventions/Progress Updates:  Patient met seated in wc in agreement with OT treatment session. 0/10 pain reported at rest and mild pain reported with activity at conclusion of session. Ice applied. Focus of session on family education in prep for safe d/c 10/5. Family education on level of supervision required for ADLs including toilet transfers, toileting, shower transfers, bathing, dressing and grooming. Education also provided on home set-up to maximize safety/independence with ADLs/IADLs. Patient/family reports BSC available to use at bedside upon return home and other AE purchased in pep for patients return to prior level of living. Discussed placement of [facility] in walk-in shower to maximize independence and cutting shower curtain to decrease water spillage. Patient/family expressed verbal understanding. Patient with concern of PFRW not fitting into bathroom. Discussed safety with hops from bathroom door to commode with HHA. Patient reports tight confines of bathroom allow for LUE support on any surface with hops to commode. Simulated in ADL apartment. All questions answered to patient/family satisfaction. Session concluded with patient seated in wc with call bell within reach and all needs met. Family seated at bedside for subsequent PT treatment session.   Therapy Documentation Precautions:  Precautions Precautions: Fall Required Braces or Orthoses: Splint/Cast Restrictions Weight Bearing Restrictions: Yes RUE Weight Bearing: Non weight bearing LLE Weight Bearing: Touchdown weight bearing Other Position/Activity Restrictions: ok to wb through R elbow on PFRW General:     Therapy/Group: Individual Therapy  Login Muckleroy R Howerton-Davis 01/09/2021, 6:48 AM

## 2021-01-10 DIAGNOSIS — S32401S Unspecified fracture of right acetabulum, sequela: Secondary | ICD-10-CM
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
## 2021-01-10 NOTE — Progress Notes (Signed)
Inpatient Rehabilitation Discharge Medication Review by a Pharmacist  A complete drug regimen review was completed for this patient to identify any potential clinically significant medication issues.  High Risk Drug Classes Is patient taking? Indication by Medication  Antipsychotic No   Anticoagulant No   Antibiotic No   Opioid Yes Oxycodone IR, Tramadol for pain  Antiplatelet No   Hypoglycemics/insulin No   Vasoactive Medication No   Chemotherapy No   Other Yes Finasteride for hair loss, Myrbetriq for urinary frequency, Xanax prn sleep, Wellbutrin XL for mood stabilization, Methocarbamol prn muscle spasms     Type of Medication Issue Identified Description of Issue Recommendation(s)  Drug Interaction(s) (clinically significant)     Duplicate Therapy     Allergy     No Medication Administration End Date     Incorrect Dose     Additional Drug Therapy Needed     Significant med changes from prior encounter (inform family/care partners about these prior to discharge).    Other       Clinically significant medication issues were identified that warrant physician communication and completion of prescribed/recommended actions by midnight of the next day:  No  Pharmacist comments: Oxycodone prn severe pain, Tramadol prn moderate pain  Time spent performing this drug regimen review (minutes):  20 minutes   Vance Peper, PharmD PGY1 Pharmacy Resident Phone [phone] 01/10/2021 7:57 AM   Please check AMION for all [place] phone numbers After 10:00 PM, call [place] [phone]

## 2021-01-12 DIAGNOSIS — F909 Attention-deficit hyperactivity disorder, unspecified type: Secondary | ICD-10-CM | POA: Diagnosis not present

## 2021-01-12 DIAGNOSIS — F419 Anxiety disorder, unspecified: Secondary | ICD-10-CM | POA: Diagnosis not present

## 2021-01-12 DIAGNOSIS — E8809 Other disorders of plasma-protein metabolism, not elsewhere classified: Secondary | ICD-10-CM | POA: Diagnosis not present

## 2021-01-12 DIAGNOSIS — S32601D Unspecified fracture of right ischium, subsequent encounter for fracture with routine healing: Secondary | ICD-10-CM | POA: Diagnosis not present

## 2021-01-12 DIAGNOSIS — Z79899 Other long term (current) drug therapy: Secondary | ICD-10-CM | POA: Diagnosis not present

## 2021-01-12 DIAGNOSIS — F4311 Post-traumatic stress disorder, acute: Secondary | ICD-10-CM | POA: Diagnosis not present

## 2021-01-12 DIAGNOSIS — D62 Acute posthemorrhagic anemia: Secondary | ICD-10-CM | POA: Diagnosis not present

## 2021-01-12 DIAGNOSIS — E46 Unspecified protein-calorie malnutrition: Secondary | ICD-10-CM | POA: Diagnosis not present

## 2021-01-12 DIAGNOSIS — M1990 Unspecified osteoarthritis, unspecified site: Secondary | ICD-10-CM | POA: Diagnosis not present

## 2021-01-12 DIAGNOSIS — E871 Hypo-osmolality and hyponatremia: Secondary | ICD-10-CM | POA: Diagnosis not present

## 2021-01-12 DIAGNOSIS — F32A Depression, unspecified: Secondary | ICD-10-CM | POA: Diagnosis not present

## 2021-01-12 DIAGNOSIS — S32602D Unspecified fracture of left ischium, subsequent encounter for fracture with routine healing: Secondary | ICD-10-CM | POA: Diagnosis not present

## 2021-01-12 DIAGNOSIS — I471 Supraventricular tachycardia: Secondary | ICD-10-CM | POA: Diagnosis not present

## 2021-01-12 DIAGNOSIS — M791 Myalgia, unspecified site: Secondary | ICD-10-CM | POA: Diagnosis not present

## 2021-01-12 DIAGNOSIS — R3915 Urgency of urination: Secondary | ICD-10-CM | POA: Diagnosis not present

## 2021-01-12 DIAGNOSIS — K5903 Drug induced constipation: Secondary | ICD-10-CM | POA: Diagnosis not present

## 2021-02-07 DIAGNOSIS — S52601A Unspecified fracture of lower end of right ulna, initial encounter for closed fracture: Secondary | ICD-10-CM | POA: Diagnosis not present

## 2021-02-07 DIAGNOSIS — S329XXA Fracture of unspecified parts of lumbosacral spine and pelvis, initial encounter for closed fracture: Secondary | ICD-10-CM | POA: Diagnosis not present

## 2021-02-07 DIAGNOSIS — S32409A Unspecified fracture of unspecified acetabulum, initial encounter for closed fracture: Secondary | ICD-10-CM | POA: Diagnosis not present

## 2021-02-08 DIAGNOSIS — S52601A Unspecified fracture of lower end of right ulna, initial encounter for closed fracture: Secondary | ICD-10-CM | POA: Diagnosis not present

## 2021-02-08 DIAGNOSIS — S329XXA Fracture of unspecified parts of lumbosacral spine and pelvis, initial encounter for closed fracture: Secondary | ICD-10-CM | POA: Diagnosis not present

## 2021-02-08 DIAGNOSIS — S32409A Unspecified fracture of unspecified acetabulum, initial encounter for closed fracture: Secondary | ICD-10-CM | POA: Diagnosis not present

## 2021-02-09 DIAGNOSIS — D2261 Melanocytic nevi of right upper limb, including shoulder: Secondary | ICD-10-CM | POA: Diagnosis not present

## 2021-02-09 DIAGNOSIS — L821 Other seborrheic keratosis: Secondary | ICD-10-CM | POA: Diagnosis not present

## 2021-02-09 DIAGNOSIS — L738 Other specified follicular disorders: Secondary | ICD-10-CM | POA: Diagnosis not present

## 2021-02-09 DIAGNOSIS — D225 Melanocytic nevi of trunk: Secondary | ICD-10-CM | POA: Diagnosis not present

## 2021-02-10 DIAGNOSIS — S52601D Unspecified fracture of lower end of right ulna, subsequent encounter for closed fracture with routine healing: Secondary | ICD-10-CM | POA: Diagnosis not present

## 2021-02-10 DIAGNOSIS — S32402D Unspecified fracture of left acetabulum, subsequent encounter for fracture with routine healing: Secondary | ICD-10-CM | POA: Diagnosis not present

## 2021-02-11 DIAGNOSIS — M791 Myalgia, unspecified site: Secondary | ICD-10-CM | POA: Diagnosis not present

## 2021-02-11 DIAGNOSIS — D62 Acute posthemorrhagic anemia: Secondary | ICD-10-CM | POA: Diagnosis not present

## 2021-02-11 DIAGNOSIS — E871 Hypo-osmolality and hyponatremia: Secondary | ICD-10-CM | POA: Diagnosis not present

## 2021-02-11 DIAGNOSIS — I471 Supraventricular tachycardia: Secondary | ICD-10-CM | POA: Diagnosis not present

## 2021-02-11 DIAGNOSIS — E8809 Other disorders of plasma-protein metabolism, not elsewhere classified: Secondary | ICD-10-CM | POA: Diagnosis not present

## 2021-02-11 DIAGNOSIS — S32601D Unspecified fracture of right ischium, subsequent encounter for fracture with routine healing: Secondary | ICD-10-CM | POA: Diagnosis not present

## 2021-02-11 DIAGNOSIS — F419 Anxiety disorder, unspecified: Secondary | ICD-10-CM | POA: Diagnosis not present

## 2021-02-11 DIAGNOSIS — E46 Unspecified protein-calorie malnutrition: Secondary | ICD-10-CM | POA: Diagnosis not present

## 2021-02-11 DIAGNOSIS — F4311 Post-traumatic stress disorder, acute: Secondary | ICD-10-CM | POA: Diagnosis not present

## 2021-02-11 DIAGNOSIS — R3915 Urgency of urination: Secondary | ICD-10-CM | POA: Diagnosis not present

## 2021-02-11 DIAGNOSIS — M1990 Unspecified osteoarthritis, unspecified site: Secondary | ICD-10-CM | POA: Diagnosis not present

## 2021-02-11 DIAGNOSIS — K5903 Drug induced constipation: Secondary | ICD-10-CM | POA: Diagnosis not present

## 2021-02-11 DIAGNOSIS — F909 Attention-deficit hyperactivity disorder, unspecified type: Secondary | ICD-10-CM | POA: Diagnosis not present

## 2021-02-11 DIAGNOSIS — F32A Depression, unspecified: Secondary | ICD-10-CM | POA: Diagnosis not present

## 2021-02-11 DIAGNOSIS — Z79899 Other long term (current) drug therapy: Secondary | ICD-10-CM | POA: Diagnosis not present

## 2021-02-11 DIAGNOSIS — S32602D Unspecified fracture of left ischium, subsequent encounter for fracture with routine healing: Secondary | ICD-10-CM | POA: Diagnosis not present

## 2021-03-09 DIAGNOSIS — M25552 Pain in left hip: Secondary | ICD-10-CM | POA: Diagnosis not present

## 2021-03-09 DIAGNOSIS — M79601 Pain in right arm: Secondary | ICD-10-CM | POA: Diagnosis not present

## 2021-03-11 DIAGNOSIS — M25552 Pain in left hip: Secondary | ICD-10-CM | POA: Diagnosis not present

## 2021-03-11 DIAGNOSIS — M79601 Pain in right arm: Secondary | ICD-10-CM | POA: Diagnosis not present

## 2021-03-12 DIAGNOSIS — F325 Major depressive disorder, single episode, in full remission: Secondary | ICD-10-CM | POA: Diagnosis not present

## 2021-03-12 DIAGNOSIS — F900 Attention-deficit hyperactivity disorder, predominantly inattentive type: Secondary | ICD-10-CM | POA: Diagnosis not present

## 2021-03-12 DIAGNOSIS — M85859 Other specified disorders of bone density and structure, unspecified thigh: Secondary | ICD-10-CM | POA: Diagnosis not present

## 2021-03-12 DIAGNOSIS — I471 Supraventricular tachycardia: Secondary | ICD-10-CM | POA: Diagnosis not present

## 2021-03-13 DIAGNOSIS — M79601 Pain in right arm: Secondary | ICD-10-CM | POA: Diagnosis not present

## 2021-03-13 DIAGNOSIS — M25552 Pain in left hip: Secondary | ICD-10-CM | POA: Diagnosis not present

## 2021-03-16 DIAGNOSIS — M25552 Pain in left hip: Secondary | ICD-10-CM | POA: Diagnosis not present

## 2021-03-16 DIAGNOSIS — M79601 Pain in right arm: Secondary | ICD-10-CM | POA: Diagnosis not present

## 2021-03-18 DIAGNOSIS — M79601 Pain in right arm: Secondary | ICD-10-CM | POA: Diagnosis not present

## 2021-03-18 DIAGNOSIS — M25552 Pain in left hip: Secondary | ICD-10-CM | POA: Diagnosis not present

## 2021-03-20 DIAGNOSIS — M79601 Pain in right arm: Secondary | ICD-10-CM | POA: Diagnosis not present

## 2021-03-20 DIAGNOSIS — M25552 Pain in left hip: Secondary | ICD-10-CM | POA: Diagnosis not present

## 2021-03-23 DIAGNOSIS — M79601 Pain in right arm: Secondary | ICD-10-CM | POA: Diagnosis not present

## 2021-03-23 DIAGNOSIS — M25552 Pain in left hip: Secondary | ICD-10-CM | POA: Diagnosis not present

## 2021-03-24 DIAGNOSIS — S32402D Unspecified fracture of left acetabulum, subsequent encounter for fracture with routine healing: Secondary | ICD-10-CM | POA: Diagnosis not present

## 2021-03-24 DIAGNOSIS — S52601D Unspecified fracture of lower end of right ulna, subsequent encounter for closed fracture with routine healing: Secondary | ICD-10-CM | POA: Diagnosis not present

## 2021-03-25 DIAGNOSIS — M25552 Pain in left hip: Secondary | ICD-10-CM | POA: Diagnosis not present

## 2021-03-25 DIAGNOSIS — M79601 Pain in right arm: Secondary | ICD-10-CM | POA: Diagnosis not present

## 2021-03-26 ENCOUNTER — Other Ambulatory Visit: Payer: Self-pay | Admitting: Internal Medicine

## 2021-03-26 DIAGNOSIS — M85859 Other specified disorders of bone density and structure, unspecified thigh: Secondary | ICD-10-CM

## 2021-03-27 DIAGNOSIS — M25552 Pain in left hip: Secondary | ICD-10-CM | POA: Diagnosis not present

## 2021-03-27 DIAGNOSIS — M79601 Pain in right arm: Secondary | ICD-10-CM | POA: Diagnosis not present

## 2021-03-30 DIAGNOSIS — M25552 Pain in left hip: Secondary | ICD-10-CM | POA: Diagnosis not present

## 2021-03-30 DIAGNOSIS — M79601 Pain in right arm: Secondary | ICD-10-CM | POA: Diagnosis not present

## 2021-04-02 DIAGNOSIS — M79601 Pain in right arm: Secondary | ICD-10-CM | POA: Diagnosis not present

## 2021-04-02 DIAGNOSIS — M25552 Pain in left hip: Secondary | ICD-10-CM | POA: Diagnosis not present

## 2021-04-03 DIAGNOSIS — M79601 Pain in right arm: Secondary | ICD-10-CM | POA: Diagnosis not present

## 2021-04-03 DIAGNOSIS — M25552 Pain in left hip: Secondary | ICD-10-CM | POA: Diagnosis not present

## 2021-04-06 DIAGNOSIS — M25552 Pain in left hip: Secondary | ICD-10-CM | POA: Diagnosis not present

## 2021-04-06 DIAGNOSIS — M79601 Pain in right arm: Secondary | ICD-10-CM | POA: Diagnosis not present

## 2021-04-08 DIAGNOSIS — M542 Cervicalgia: Secondary | ICD-10-CM | POA: Diagnosis not present

## 2021-04-08 DIAGNOSIS — M25552 Pain in left hip: Secondary | ICD-10-CM | POA: Diagnosis not present

## 2021-04-08 DIAGNOSIS — M79601 Pain in right arm: Secondary | ICD-10-CM | POA: Diagnosis not present

## 2021-04-09 DIAGNOSIS — M85852 Other specified disorders of bone density and structure, left thigh: Secondary | ICD-10-CM | POA: Diagnosis not present

## 2021-04-09 DIAGNOSIS — M85851 Other specified disorders of bone density and structure, right thigh: Secondary | ICD-10-CM | POA: Diagnosis not present

## 2021-04-09 DIAGNOSIS — Z780 Asymptomatic menopausal state: Secondary | ICD-10-CM | POA: Diagnosis not present

## 2021-04-09 DIAGNOSIS — Z1231 Encounter for screening mammogram for malignant neoplasm of breast: Secondary | ICD-10-CM | POA: Diagnosis not present

## 2021-04-10 DIAGNOSIS — M25552 Pain in left hip: Secondary | ICD-10-CM | POA: Diagnosis not present

## 2021-04-10 DIAGNOSIS — M79601 Pain in right arm: Secondary | ICD-10-CM | POA: Diagnosis not present

## 2021-04-10 DIAGNOSIS — M542 Cervicalgia: Secondary | ICD-10-CM | POA: Diagnosis not present

## 2021-04-13 DIAGNOSIS — M25552 Pain in left hip: Secondary | ICD-10-CM | POA: Diagnosis not present

## 2021-04-13 DIAGNOSIS — M79601 Pain in right arm: Secondary | ICD-10-CM | POA: Diagnosis not present

## 2021-04-13 DIAGNOSIS — M542 Cervicalgia: Secondary | ICD-10-CM | POA: Diagnosis not present

## 2021-04-15 DIAGNOSIS — M25552 Pain in left hip: Secondary | ICD-10-CM | POA: Diagnosis not present

## 2021-04-15 DIAGNOSIS — M542 Cervicalgia: Secondary | ICD-10-CM | POA: Diagnosis not present

## 2021-04-15 DIAGNOSIS — M79601 Pain in right arm: Secondary | ICD-10-CM | POA: Diagnosis not present

## 2021-04-17 DIAGNOSIS — M25552 Pain in left hip: Secondary | ICD-10-CM | POA: Diagnosis not present

## 2021-04-17 DIAGNOSIS — M79601 Pain in right arm: Secondary | ICD-10-CM | POA: Diagnosis not present

## 2021-04-17 DIAGNOSIS — M542 Cervicalgia: Secondary | ICD-10-CM | POA: Diagnosis not present

## 2021-04-20 DIAGNOSIS — M79601 Pain in right arm: Secondary | ICD-10-CM | POA: Diagnosis not present

## 2021-04-20 DIAGNOSIS — M542 Cervicalgia: Secondary | ICD-10-CM | POA: Diagnosis not present

## 2021-04-20 DIAGNOSIS — M25552 Pain in left hip: Secondary | ICD-10-CM | POA: Diagnosis not present

## 2021-04-22 DIAGNOSIS — M79601 Pain in right arm: Secondary | ICD-10-CM | POA: Diagnosis not present

## 2021-04-22 DIAGNOSIS — M25552 Pain in left hip: Secondary | ICD-10-CM | POA: Diagnosis not present

## 2021-04-22 DIAGNOSIS — M542 Cervicalgia: Secondary | ICD-10-CM | POA: Diagnosis not present

## 2021-04-24 DIAGNOSIS — M542 Cervicalgia: Secondary | ICD-10-CM | POA: Diagnosis not present

## 2021-04-24 DIAGNOSIS — M19012 Primary osteoarthritis, left shoulder: Secondary | ICD-10-CM | POA: Diagnosis not present

## 2021-04-24 DIAGNOSIS — M25552 Pain in left hip: Secondary | ICD-10-CM | POA: Diagnosis not present

## 2021-04-24 DIAGNOSIS — M79601 Pain in right arm: Secondary | ICD-10-CM | POA: Diagnosis not present

## 2021-04-27 DIAGNOSIS — M25552 Pain in left hip: Secondary | ICD-10-CM | POA: Diagnosis not present

## 2021-04-27 DIAGNOSIS — M542 Cervicalgia: Secondary | ICD-10-CM | POA: Diagnosis not present

## 2021-04-27 DIAGNOSIS — M79601 Pain in right arm: Secondary | ICD-10-CM | POA: Diagnosis not present

## 2021-04-29 DIAGNOSIS — M542 Cervicalgia: Secondary | ICD-10-CM | POA: Diagnosis not present

## 2021-04-29 DIAGNOSIS — M25552 Pain in left hip: Secondary | ICD-10-CM | POA: Diagnosis not present

## 2021-04-29 DIAGNOSIS — M79601 Pain in right arm: Secondary | ICD-10-CM | POA: Diagnosis not present

## 2021-05-11 DIAGNOSIS — S32402D Unspecified fracture of left acetabulum, subsequent encounter for fracture with routine healing: Secondary | ICD-10-CM | POA: Diagnosis not present

## 2021-05-13 DIAGNOSIS — H5203 Hypermetropia, bilateral: Secondary | ICD-10-CM | POA: Diagnosis not present

## 2021-05-13 DIAGNOSIS — H353131 Nonexudative age-related macular degeneration, bilateral, early dry stage: Secondary | ICD-10-CM | POA: Diagnosis not present

## 2021-05-13 DIAGNOSIS — H2513 Age-related nuclear cataract, bilateral: Secondary | ICD-10-CM | POA: Diagnosis not present

## 2021-05-25 DIAGNOSIS — M25552 Pain in left hip: Secondary | ICD-10-CM | POA: Diagnosis not present

## 2021-05-27 DIAGNOSIS — M25552 Pain in left hip: Secondary | ICD-10-CM | POA: Diagnosis not present

## 2021-06-01 DIAGNOSIS — M25552 Pain in left hip: Secondary | ICD-10-CM | POA: Diagnosis not present

## 2021-06-03 DIAGNOSIS — M25552 Pain in left hip: Secondary | ICD-10-CM | POA: Diagnosis not present

## 2021-06-04 DIAGNOSIS — F331 Major depressive disorder, recurrent, moderate: Secondary | ICD-10-CM | POA: Diagnosis not present

## 2021-06-08 DIAGNOSIS — M25552 Pain in left hip: Secondary | ICD-10-CM | POA: Diagnosis not present

## 2021-06-10 DIAGNOSIS — M25552 Pain in left hip: Secondary | ICD-10-CM | POA: Diagnosis not present

## 2021-06-15 DIAGNOSIS — M25552 Pain in left hip: Secondary | ICD-10-CM | POA: Diagnosis not present

## 2021-06-16 DIAGNOSIS — M25552 Pain in left hip: Secondary | ICD-10-CM | POA: Insufficient documentation

## 2021-06-17 ENCOUNTER — Ambulatory Visit
Admission: RE | Admit: 2021-06-17 | Discharge: 2021-06-17 | Disposition: A | Discharge status: Home / Self Care | Payer: Medicare Other | Source: Ambulatory Visit | Attending: Sports Medicine | Admitting: Sports Medicine

## 2021-06-17 ENCOUNTER — Other Ambulatory Visit: Payer: Self-pay | Admitting: Sports Medicine

## 2021-06-17 DIAGNOSIS — S32591G Other specified fracture of right pubis, subsequent encounter for fracture with delayed healing: Secondary | ICD-10-CM | POA: Diagnosis not present

## 2021-06-17 DIAGNOSIS — M25552 Pain in left hip: Secondary | ICD-10-CM

## 2021-06-17 DIAGNOSIS — S32599A Other specified fracture of unspecified pubis, initial encounter for closed fracture: Secondary | ICD-10-CM | POA: Insufficient documentation

## 2021-07-02 DIAGNOSIS — R7301 Impaired fasting glucose: Secondary | ICD-10-CM | POA: Diagnosis not present

## 2021-07-02 DIAGNOSIS — Z79899 Other long term (current) drug therapy: Secondary | ICD-10-CM | POA: Diagnosis not present

## 2021-07-02 DIAGNOSIS — R7989 Other specified abnormal findings of blood chemistry: Secondary | ICD-10-CM | POA: Diagnosis not present

## 2021-07-02 DIAGNOSIS — M85859 Other specified disorders of bone density and structure, unspecified thigh: Secondary | ICD-10-CM | POA: Diagnosis not present

## 2021-07-07 DIAGNOSIS — M85859 Other specified disorders of bone density and structure, unspecified thigh: Secondary | ICD-10-CM | POA: Diagnosis not present

## 2021-07-07 DIAGNOSIS — S32402D Unspecified fracture of left acetabulum, subsequent encounter for fracture with routine healing: Secondary | ICD-10-CM | POA: Diagnosis not present

## 2021-07-07 DIAGNOSIS — F900 Attention-deficit hyperactivity disorder, predominantly inattentive type: Secondary | ICD-10-CM | POA: Diagnosis not present

## 2021-07-07 DIAGNOSIS — F339 Major depressive disorder, recurrent, unspecified: Secondary | ICD-10-CM | POA: Diagnosis not present

## 2021-07-07 DIAGNOSIS — Z0000 Encounter for general adult medical examination without abnormal findings: Secondary | ICD-10-CM | POA: Diagnosis not present

## 2021-07-07 DIAGNOSIS — S32591G Other specified fracture of right pubis, subsequent encounter for fracture with delayed healing: Secondary | ICD-10-CM | POA: Diagnosis not present

## 2021-07-07 DIAGNOSIS — M1612 Unilateral primary osteoarthritis, left hip: Secondary | ICD-10-CM | POA: Diagnosis not present

## 2021-08-31 DIAGNOSIS — R490 Dysphonia: Secondary | ICD-10-CM | POA: Diagnosis not present

## 2021-08-31 DIAGNOSIS — R5383 Other fatigue: Secondary | ICD-10-CM | POA: Diagnosis not present

## 2021-08-31 DIAGNOSIS — R0981 Nasal congestion: Secondary | ICD-10-CM | POA: Diagnosis not present

## 2021-08-31 DIAGNOSIS — J069 Acute upper respiratory infection, unspecified: Secondary | ICD-10-CM | POA: Diagnosis not present

## 2021-09-09 DIAGNOSIS — H903 Sensorineural hearing loss, bilateral: Secondary | ICD-10-CM | POA: Diagnosis not present

## 2021-09-17 DIAGNOSIS — F331 Major depressive disorder, recurrent, moderate: Secondary | ICD-10-CM | POA: Diagnosis not present

## 2021-10-29 DIAGNOSIS — F331 Major depressive disorder, recurrent, moderate: Secondary | ICD-10-CM | POA: Diagnosis not present

## 2021-11-13 DIAGNOSIS — R058 Other specified cough: Secondary | ICD-10-CM | POA: Diagnosis not present

## 2021-11-13 DIAGNOSIS — R059 Cough, unspecified: Secondary | ICD-10-CM | POA: Insufficient documentation

## 2021-11-13 DIAGNOSIS — J384 Edema of larynx: Secondary | ICD-10-CM | POA: Diagnosis not present

## 2021-11-13 DIAGNOSIS — H9113 Presbycusis, bilateral: Secondary | ICD-10-CM | POA: Insufficient documentation

## 2021-11-27 DIAGNOSIS — H524 Presbyopia: Secondary | ICD-10-CM | POA: Diagnosis not present

## 2021-12-08 DIAGNOSIS — H903 Sensorineural hearing loss, bilateral: Secondary | ICD-10-CM | POA: Insufficient documentation

## 2021-12-10 DIAGNOSIS — R051 Acute cough: Secondary | ICD-10-CM | POA: Diagnosis not present

## 2021-12-10 DIAGNOSIS — J3489 Other specified disorders of nose and nasal sinuses: Secondary | ICD-10-CM | POA: Diagnosis not present

## 2021-12-10 DIAGNOSIS — R5383 Other fatigue: Secondary | ICD-10-CM | POA: Diagnosis not present

## 2021-12-10 DIAGNOSIS — R0981 Nasal congestion: Secondary | ICD-10-CM | POA: Diagnosis not present

## 2021-12-10 DIAGNOSIS — F331 Major depressive disorder, recurrent, moderate: Secondary | ICD-10-CM | POA: Diagnosis not present

## 2021-12-29 DIAGNOSIS — J069 Acute upper respiratory infection, unspecified: Secondary | ICD-10-CM | POA: Diagnosis not present

## 2021-12-29 DIAGNOSIS — R600 Localized edema: Secondary | ICD-10-CM | POA: Diagnosis not present

## 2021-12-29 DIAGNOSIS — R051 Acute cough: Secondary | ICD-10-CM | POA: Diagnosis not present

## 2021-12-30 ENCOUNTER — Other Ambulatory Visit (HOSPITAL_COMMUNITY): Payer: Self-pay | Admitting: Internal Medicine

## 2021-12-30 ENCOUNTER — Other Ambulatory Visit (HOSPITAL_COMMUNITY): Payer: Self-pay | Admitting: Registered Nurse

## 2021-12-30 ENCOUNTER — Ambulatory Visit (HOSPITAL_COMMUNITY)
Admission: RE | Admit: 2021-12-30 | Discharge: 2021-12-30 | Disposition: A | Discharge status: Home / Self Care | Payer: Medicare Other | Source: Ambulatory Visit | Attending: Cardiology | Admitting: Cardiology

## 2021-12-30 DIAGNOSIS — R600 Localized edema: Secondary | ICD-10-CM | POA: Insufficient documentation

## 2022-01-07 DIAGNOSIS — F331 Major depressive disorder, recurrent, moderate: Secondary | ICD-10-CM | POA: Diagnosis not present

## 2022-01-27 ENCOUNTER — Encounter: Payer: Self-pay | Admitting: Gastroenterology

## 2022-02-04 DIAGNOSIS — R1032 Left lower quadrant pain: Secondary | ICD-10-CM | POA: Diagnosis not present

## 2022-02-05 ENCOUNTER — Ambulatory Visit (HOSPITAL_BASED_OUTPATIENT_CLINIC_OR_DEPARTMENT_OTHER)
Admission: RE | Admit: 2022-02-05 | Discharge: 2022-02-05 | Disposition: A | Discharge status: Home / Self Care | Payer: Medicare Other | Source: Ambulatory Visit | Attending: Internal Medicine | Admitting: Internal Medicine

## 2022-02-05 ENCOUNTER — Other Ambulatory Visit (HOSPITAL_COMMUNITY): Payer: Self-pay | Admitting: Internal Medicine

## 2022-02-05 DIAGNOSIS — R1032 Left lower quadrant pain: Secondary | ICD-10-CM

## 2022-02-05 DIAGNOSIS — R109 Unspecified abdominal pain: Secondary | ICD-10-CM | POA: Diagnosis not present

## 2022-02-05 MED ORDER — IOHEXOL 300 MG/ML  SOLN
80.0000 mL | Freq: Once | INTRAMUSCULAR | Status: AC | PRN
  Administered 2022-02-05: 80 mL via INTRAVENOUS

## 2022-02-10 ENCOUNTER — Ambulatory Visit (INDEPENDENT_AMBULATORY_CARE_PROVIDER_SITE_OTHER): Payer: Medicare Other | Admitting: Pulmonary Disease

## 2022-02-10 ENCOUNTER — Encounter: Payer: Self-pay | Admitting: Pulmonary Disease

## 2022-02-10 VITALS — BP 112/64 | HR 72 | Temp 98.1°F | Ht 61.0 in | Wt 139.4 lb

## 2022-02-10 DIAGNOSIS — R053 Chronic cough: Secondary | ICD-10-CM

## 2022-02-10 MED ORDER — AZITHROMYCIN 250 MG PO TABS: ORAL_TABLET | ORAL | 0 refills | Status: DC

## 2022-02-10 MED ORDER — BENZONATATE 200 MG PO CAPS: 200.0000 mg | ORAL_CAPSULE | Freq: Three times a day (TID) | ORAL | 1 refills | Status: DC | PRN

## 2022-02-10 MED ORDER — IPRATROPIUM BROMIDE HFA 17 MCG/ACT IN AERS: 2.0000 | INHALATION_SPRAY | RESPIRATORY_TRACT | 12 refills | Status: DC | PRN

## 2022-02-10 MED ORDER — PREDNISONE 20 MG PO TABS: ORAL_TABLET | ORAL | 0 refills | Status: DC
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
## 2022-02-10 NOTE — Patient Instructions (Signed)
I will see you back in about 6 weeks  Prescription for azithromycin for 5 days  Prescription for prednisone for 7 days  Prescription for ipratropium to be used 2 puffs up to 4 times a day for the cough  Prescription for Tessalon Perles  Consider Delsym-over-the-counter cough medicine should help your cough  Call with significant concerns

## 2022-02-12 DIAGNOSIS — F331 Major depressive disorder, recurrent, moderate: Secondary | ICD-10-CM | POA: Diagnosis not present

## 2022-03-04 ENCOUNTER — Telehealth: Payer: Self-pay | Admitting: Pulmonary Disease

## 2022-03-08 HISTORY — PX: MOUTH SURGERY: SHX715

## 2022-03-11 ENCOUNTER — Telehealth: Payer: Self-pay | Admitting: Internal Medicine

## 2022-03-11 NOTE — Telephone Encounter (Signed)
Copy of ultrasound test results faxed to [facility] 03/11/2022.  Pt made aware records were faxed.

## 2022-03-11 NOTE — Telephone Encounter (Signed)
Office calling to request copy of results from ultra sound that pt had done on 12/30/21. Please advise

## 2022-03-15 ENCOUNTER — Ambulatory Visit: Payer: BC Managed Care – PPO | Admitting: Pulmonary Disease

## 2022-03-15 DIAGNOSIS — R053 Chronic cough: Secondary | ICD-10-CM | POA: Diagnosis not present

## 2022-03-15 LAB — PULMONARY FUNCTION TEST
DL/VA % pred: 107 %
DL/VA: 4.5 ml/min/mmHg/L
DLCO cor % pred: 97 %
DLCO cor: 16.93 ml/min/mmHg
DLCO unc % pred: 97 %
DLCO unc: 16.93 ml/min/mmHg
FEF 25-75 Post: 2.16 L/sec
FEF 25-75 Pre: 1.99 L/sec
FEF2575-%Change-Post: 8 %
FEF2575-%Pred-Post: 140 %
FEF2575-%Pred-Pre: 129 %
FEV1-%Change-Post: 2 %
FEV1-%Pred-Post: 112 %
FEV1-%Pred-Pre: 109 %
FEV1-Post: 2.09 L
FEV1-Pre: 2.03 L
FEV1FVC-%Change-Post: 0 %
FEV1FVC-%Pred-Pre: 106 %
FEV6-%Change-Post: 3 %
FEV6-%Pred-Post: 110 %
FEV6-%Pred-Pre: 107 %
FEV6-Post: 2.61 L
FEV6-Pre: 2.53 L
FEV6FVC-%Pred-Post: 105 %
FEV6FVC-%Pred-Pre: 105 %
FVC-%Change-Post: 3 %
FVC-%Pred-Post: 104 %
FVC-%Pred-Pre: 101 %
FVC-Post: 2.61 L
FVC-Pre: 2.53 L
Post FEV1/FVC ratio: 80 %
Post FEV6/FVC ratio: 100 %
Pre FEV1/FVC ratio: 80 %
Pre FEV6/FVC Ratio: 100 %
RV % pred: 100 %
RV: 2.13 L
TLC % pred: 114 %
TLC: 5.26 L

## 2022-03-15 NOTE — Progress Notes (Signed)
Full PFT performed today. °

## 2022-03-15 NOTE — Patient Instructions (Signed)
Full PFT performed today. °

## 2022-03-17 ENCOUNTER — Encounter: Payer: Self-pay | Admitting: Gastroenterology

## 2022-03-17 ENCOUNTER — Ambulatory Visit: Payer: Medicare Other | Admitting: Gastroenterology

## 2022-03-17 VITALS — BP 120/62 | HR 65 | Ht 61.0 in | Wt 137.6 lb

## 2022-03-17 DIAGNOSIS — R490 Dysphonia: Secondary | ICD-10-CM | POA: Diagnosis not present

## 2022-03-17 DIAGNOSIS — R053 Chronic cough: Secondary | ICD-10-CM | POA: Diagnosis not present

## 2022-03-17 DIAGNOSIS — F331 Major depressive disorder, recurrent, moderate: Secondary | ICD-10-CM | POA: Diagnosis not present

## 2022-03-17 MED ORDER — PANTOPRAZOLE SODIUM 40 MG PO TBEC: 40.0000 mg | DELAYED_RELEASE_TABLET | Freq: Two times a day (BID) | ORAL | 1 refills | Status: DC
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
## 2022-03-17 NOTE — Patient Instructions (Addendum)
It was a pleasure to meet you today.  I recommend an upper endoscopy.  I recommend pantoprazole 40 mg twice daily for at least 10 weeks.   Modifying diet and lifestyle remains the foundation for treating the symptoms of reflux.   The following strategies help you prevent heartburn and other symptoms by avoiding foods that reduce the effectiveness of the bottom of the esophagus from protecting the esophagus from from acid injury and keeping stomach contents where they belong.  Eat smaller meals. A large meal remains in the stomach for several hours, increasing the chances for gastroesophageal reflux. Try distributing your daily food intake over three, four, or five smaller meals.  Relax when you eat. Stress increases the production of stomach acid, so make meals a pleasant, relaxing experience. Sit down. Eat slowly. Chew completely. Play soothing music.  Relax between meals. Relaxation therapies such as deep breathing, meditation, massage, tai chi, or yoga may help prevent and relieve heartburn.   Remain upright after eating. You should maintain postures that reduce the risk for reflux for at least three hours after eating. For example, don't bend over or strain to lift heavy objects.  Avoid eating within three hours of going to bed. Lying down after eating will increase chances of reflux.  Lose weight. Excess pounds increase pressure on the stomach and can push acid into the esophagus.  Loosen up. Avoid tight belts, waistbands, and other clothing that puts pressure on your stomach.  Avoid foods that burn. Abstain from food or drink that increases gastric acid secretion, decreases the valve at the bottom of the esophagus, or slows the emptying of the stomach. Known offenders include high-fat foods, spicy dishes, tomatoes and tomato products, citrus fruits, garlic, onions, milk, carbonated drinks, coffee (including decaf), tea, chocolate, mints, and alcohol.  Avoid medications that can  predispose you to reflux including aspirin and other NSAIDs, oral contraceptives, hormone therapy drugs, and certain antidepressants.  Sleep at an angle. If you're bothered by nighttime heartburn, place a wedge (available in medical supply stores or a wedge pillow through [employer]) under your upper body. But don't elevate your head with extra pillows. That makes reflux worse by bending you at the waist and compressing your stomach. You might also try sleeping on your left side, as studies have shown this can help--perhaps because the stomach is on the left side of the body, so lying on your left positions most of the stomach below the bottom of the esophagus.

## 2022-03-19 ENCOUNTER — Encounter: Payer: Self-pay | Admitting: Pulmonary Disease

## 2022-03-19 ENCOUNTER — Ambulatory Visit: Payer: Medicare Other | Admitting: Pulmonary Disease

## 2022-03-19 VITALS — BP 112/68 | HR 62 | Ht 61.0 in | Wt 138.6 lb

## 2022-03-19 DIAGNOSIS — R053 Chronic cough: Secondary | ICD-10-CM | POA: Diagnosis not present
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
## 2022-03-19 NOTE — Patient Instructions (Signed)
I will see you back in about 6 months  Follow-up with the GI doctor regarding endoscopy and the PPI  You can call us for a refill on the Tessalon Perles or ipratropium if your cough were to worsen  Your breathing study is reassuring, it is normal  Call with any concerns

## 2022-03-29 DIAGNOSIS — K080 Exfoliation of teeth due to systemic causes: Secondary | ICD-10-CM | POA: Diagnosis not present

## 2022-04-02 DIAGNOSIS — K439 Ventral hernia without obstruction or gangrene: Secondary | ICD-10-CM | POA: Diagnosis not present

## 2022-04-06 DIAGNOSIS — L661 Lichen planopilaris: Secondary | ICD-10-CM | POA: Diagnosis not present

## 2022-04-06 DIAGNOSIS — L298 Other pruritus: Secondary | ICD-10-CM | POA: Diagnosis not present

## 2022-04-06 DIAGNOSIS — L821 Other seborrheic keratosis: Secondary | ICD-10-CM | POA: Diagnosis not present

## 2022-04-06 DIAGNOSIS — B009 Herpesviral infection, unspecified: Secondary | ICD-10-CM | POA: Diagnosis not present

## 2022-04-14 NOTE — Telephone Encounter (Signed)
Will close encounter

## 2022-04-15 DIAGNOSIS — Z1231 Encounter for screening mammogram for malignant neoplasm of breast: Secondary | ICD-10-CM | POA: Diagnosis not present

## 2022-04-22 DIAGNOSIS — F331 Major depressive disorder, recurrent, moderate: Secondary | ICD-10-CM | POA: Diagnosis not present

## 2022-05-07 ENCOUNTER — Encounter: Payer: Medicare Other | Admitting: Gastroenterology

## 2022-05-18 DIAGNOSIS — H5203 Hypermetropia, bilateral: Secondary | ICD-10-CM | POA: Diagnosis not present

## 2022-05-18 DIAGNOSIS — H353131 Nonexudative age-related macular degeneration, bilateral, early dry stage: Secondary | ICD-10-CM | POA: Diagnosis not present

## 2022-06-02 ENCOUNTER — Ambulatory Visit (INDEPENDENT_AMBULATORY_CARE_PROVIDER_SITE_OTHER): Payer: Medicare Other

## 2022-06-02 ENCOUNTER — Encounter: Payer: Self-pay | Admitting: Podiatry

## 2022-06-02 ENCOUNTER — Ambulatory Visit: Payer: Medicare Other | Admitting: Podiatry

## 2022-06-02 DIAGNOSIS — M2011 Hallux valgus (acquired), right foot: Secondary | ICD-10-CM

## 2022-06-02 DIAGNOSIS — M2012 Hallux valgus (acquired), left foot: Secondary | ICD-10-CM

## 2022-06-02 DIAGNOSIS — L84 Corns and callosities: Secondary | ICD-10-CM

## 2022-06-02 DIAGNOSIS — M2041 Other hammer toe(s) (acquired), right foot: Secondary | ICD-10-CM | POA: Diagnosis not present

## 2022-06-02 DIAGNOSIS — M2042 Other hammer toe(s) (acquired), left foot: Secondary | ICD-10-CM
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
## 2022-06-24 DIAGNOSIS — F331 Major depressive disorder, recurrent, moderate: Secondary | ICD-10-CM | POA: Diagnosis not present

## 2022-07-01 DIAGNOSIS — H5213 Myopia, bilateral: Secondary | ICD-10-CM | POA: Diagnosis not present

## 2022-07-07 DIAGNOSIS — I10 Essential (primary) hypertension: Secondary | ICD-10-CM | POA: Diagnosis not present

## 2022-07-08 DIAGNOSIS — K439 Ventral hernia without obstruction or gangrene: Secondary | ICD-10-CM | POA: Diagnosis not present

## 2022-07-14 DIAGNOSIS — Z0000 Encounter for general adult medical examination without abnormal findings: Secondary | ICD-10-CM | POA: Diagnosis not present

## 2022-07-14 DIAGNOSIS — R7301 Impaired fasting glucose: Secondary | ICD-10-CM | POA: Diagnosis not present

## 2022-07-14 DIAGNOSIS — F900 Attention-deficit hyperactivity disorder, predominantly inattentive type: Secondary | ICD-10-CM | POA: Diagnosis not present

## 2022-07-14 DIAGNOSIS — F339 Major depressive disorder, recurrent, unspecified: Secondary | ICD-10-CM | POA: Diagnosis not present

## 2022-07-14 DIAGNOSIS — Z1331 Encounter for screening for depression: Secondary | ICD-10-CM | POA: Diagnosis not present

## 2022-07-14 DIAGNOSIS — M85859 Other specified disorders of bone density and structure, unspecified thigh: Secondary | ICD-10-CM | POA: Diagnosis not present

## 2022-07-14 DIAGNOSIS — Z1339 Encounter for screening examination for other mental health and behavioral disorders: Secondary | ICD-10-CM | POA: Diagnosis not present

## 2022-07-20 DIAGNOSIS — F331 Major depressive disorder, recurrent, moderate: Secondary | ICD-10-CM | POA: Diagnosis not present

## 2022-07-29 ENCOUNTER — Ambulatory Visit: Payer: Self-pay | Admitting: General Surgery

## 2022-08-04 ENCOUNTER — Encounter (HOSPITAL_COMMUNITY): Payer: Self-pay

## 2022-08-05 DIAGNOSIS — K080 Exfoliation of teeth due to systemic causes: Secondary | ICD-10-CM | POA: Diagnosis not present

## 2022-08-11 NOTE — Patient Instructions (Addendum)
SURGICAL WAITING ROOM VISITATION Patients having surgery or a procedure may have no more than 2 support people in the waiting area - these visitors may rotate in the visitor waiting room.   Due to an increase in RSV and influenza rates and associated hospitalizations, children ages 62 and under may not visit patients in [facility] hospitals. If the patient needs to stay at the hospital during part of their recovery, the visitor guidelines for inpatient rooms apply.  PRE-OP VISITATION  Pre-op nurse will coordinate an appropriate time for 1 support person to accompany the patient in pre-op.  This support person may not rotate.  This visitor will be contacted when the time is appropriate for the visitor to come back in the pre-op area.  Please refer to the [facility] website for the visitor guidelines for Inpatients (after your surgery is over and you are in a regular room).  You are not required to quarantine at this time prior to your surgery. However, you must do this: Hand Hygiene often Do NOT share personal items Notify your provider if you are in close contact with someone who has COVID or you develop fever 100.4 or greater, new onset of sneezing, cough, sore throat, shortness of breath or body aches.  If you test positive for Covid or have been in contact with anyone that has tested positive in the last 10 days please notify you surgeon.    Your procedure is scheduled on:  Wednesday  August 25, 2022  Report to [facility] Main Entrance: [address] entrance where the [employer] is available.   Report to admitting at: 06:15    AM  +++++Call this number if you have any questions or problems the morning of surgery [phone]  Do not eat food after Midnight the night prior to your surgery/procedure.  After Midnight you may have the following liquids until   05:30 AM DAY OF SURGERY  Clear Liquid Diet Water Black Coffee (sugar ok, NO MILK/CREAM OR CREAMERS)  Tea (sugar ok, NO  MILK/CREAM OR CREAMERS) regular and decaf                             Plain Jell-O  with no fruit (NO RED)                                           Fruit ices (not with fruit pulp, NO RED)                                     Popsicles (NO RED)                                                                  Juice: apple, WHITE grape, WHITE cranberry Sports drinks like Gatorade or Powerade (NO RED)                    The day of surgery:  Drink ONE (1) Pre-Surgery Clear Ensure at   05:30     AM the morning  of surgery. Drink in one sitting. Do not sip.  This drink was given to you during your hospital pre-op appointment visit. Nothing else to drink after completing the Pre-Surgery Clear Ensure  : No candy, chewing gum or throat lozenges.    FOLLOW BOWEL PREP AND ANY ADDITIONAL PRE OP INSTRUCTIONS YOU RECEIVED FROM YOUR SURGEN'S OFFICE!!!   Oral Hygiene is also important to reduce your risk of infection.        Remember - BRUSH YOUR TEETH THE MORNING OF SURGERY WITH YOUR REGULAR TOOTHPASTE  Do NOT smoke after Midnight the night before surgery.  Take ONLY these medicines the morning of surgery with A SIP OF WATER: finasteride (Proscar), bupropion (Wellbutrin)  You may not have any metal on your body including hair pins, jewelry, and body piercing  Do not wear make-up, lotions, powders, perfumes or deodorant  Do not wear nail polish including gel and S&S, artificial / acrylic nails, or any other type of covering on natural nails including finger and toenails. If you have artificial nails, gel coating, etc., that needs to be removed by a nail salon, Please have this removed prior to surgery. Not doing so may mean that your surgery could be cancelled or delayed if the Surgeon or anesthesia staff feels like they are unable to monitor you safely.   Do not shave 48 hours prior to surgery to avoid nicks in your skin which may contribute to postoperative infections.   Contacts, Hearing Aids,  dentures or bridgework may not be worn into surgery. DENTURES WILL BE REMOVED PRIOR TO SURGERY PLEASE DO NOT APPLY "Poly grip" OR ADHESIVES!!!  You may bring a small overnight bag with you on the day of surgery, only pack items that are not valuable. [facility] IS NOT RESPONSIBLE   FOR VALUABLES THAT ARE LOST OR STOLEN.   Do not bring your home medications to the hospital. The Pharmacy will dispense medications listed on your medication list to you during your admission in the Hospital.  Special Instructions: Bring a copy of your healthcare power of attorney and living will documents the day of surgery, if you wish to have them scanned into your [facility]- EPIC  Please read over the following fact sheets you were given: IF YOU HAVE QUESTIONS ABOUT YOUR PRE-OP INSTRUCTIONS, PLEASE CALL [phone].   [facility] - Preparing for Surgery Before surgery, you can play an important role.  Because skin is not sterile, your skin needs to be as free of germs as possible.  You can reduce the number of germs on your skin by washing with CHG (chlorahexidine gluconate) soap before surgery.  CHG is an antiseptic cleaner which kills germs and bonds with the skin to continue killing germs even after washing. Please DO NOT use if you have an allergy to CHG or antibacterial soaps.  If your skin becomes reddened/irritated stop using the CHG and inform your nurse when you arrive at Short Stay. Do not shave (including legs and underarms) for at least 48 hours prior to the first CHG shower.  You may shave your face/neck.  Please follow these instructions carefully:  1.  Shower with CHG Soap the night before surgery and the  morning of surgery.  2.  If you choose to wash your hair, wash your hair first as usual with your normal  shampoo.  3.  After you shampoo, rinse your hair and body thoroughly to remove the shampoo.  4.  Use CHG as you would any other liquid soap.  You  can apply chg directly to the skin and wash.  Gently with a scrungie or clean washcloth.  5.  Apply the CHG Soap to your body ONLY FROM THE NECK DOWN.   Do not use on face/ open                           Wound or open sores. Avoid contact with eyes, ears mouth and genitals (private parts).                       Wash face,  Genitals (private parts) with your normal soap.             6.  Wash thoroughly, paying special attention to the area where your  surgery  will be performed.  7.  Thoroughly rinse your body with warm water from the neck down.  8.  DO NOT shower/wash with your normal soap after using and rinsing off the CHG Soap.            9.  Pat yourself dry with a clean towel.            10.  Wear clean pajamas.            11.  Place clean sheets on your bed the night of your first shower and do not  sleep with pets.  ON THE DAY OF SURGERY : Do not apply any lotions/deodorants the morning of surgery.  Please wear clean clothes to the hospital/surgery center.    FAILURE TO FOLLOW THESE INSTRUCTIONS MAY RESULT IN THE CANCELLATION OF YOUR SURGERY  PATIENT SIGNATURE_________________________________  NURSE SIGNATURE__________________________________  ________________________________________________________________________
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
## 2022-08-13 ENCOUNTER — Encounter (HOSPITAL_COMMUNITY)
Admission: RE | Admit: 2022-08-13 | Discharge: 2022-08-13 | Disposition: A | Discharge status: Home / Self Care | Payer: Medicare Other | Source: Ambulatory Visit | Attending: General Surgery | Admitting: General Surgery

## 2022-08-13 ENCOUNTER — Other Ambulatory Visit: Payer: Self-pay

## 2022-08-13 ENCOUNTER — Encounter (HOSPITAL_COMMUNITY): Payer: Self-pay

## 2022-08-13 VITALS — BP 119/77 | HR 55 | Temp 98.2°F | Resp 16 | Ht 60.5 in | Wt 132.0 lb

## 2022-08-13 DIAGNOSIS — I10 Essential (primary) hypertension: Secondary | ICD-10-CM | POA: Insufficient documentation

## 2022-08-13 DIAGNOSIS — Z01818 Encounter for other preprocedural examination: Secondary | ICD-10-CM | POA: Diagnosis not present

## 2022-08-13 DIAGNOSIS — I471 Supraventricular tachycardia, unspecified: Secondary | ICD-10-CM | POA: Insufficient documentation

## 2022-08-13 HISTORY — DX: Anemia, unspecified: D64.9

## 2022-08-13 HISTORY — DX: Gastro-esophageal reflux disease without esophagitis: K21.9

## 2022-08-13 HISTORY — DX: Cardiac arrhythmia, unspecified: I49.9

## 2022-08-13 HISTORY — DX: Other complications of anesthesia, initial encounter: T88.59XA

## 2022-08-13 LAB — CBC
HCT: 35.3 % — ABNORMAL LOW (ref 36.0–46.0)
Hemoglobin: 11.9 g/dL — ABNORMAL LOW (ref 12.0–15.0)
MCH: 30.4 pg (ref 26.0–34.0)
MCHC: 33.7 g/dL (ref 30.0–36.0)
MCV: 90.1 fL (ref 80.0–100.0)
Platelets: 324 10*3/uL (ref 150–400)
RBC: 3.92 MIL/uL (ref 3.87–5.11)
RDW: 12.4 % (ref 11.5–15.5)
WBC: 4.7 10*3/uL (ref 4.0–10.5)
nRBC: 0 % (ref 0.0–0.2)

## 2022-08-13 LAB — BASIC METABOLIC PANEL
Anion gap: 8 (ref 5–15)
BUN: 27 mg/dL — ABNORMAL HIGH (ref 8–23)
CO2: 25 mmol/L (ref 22–32)
Calcium: 9.1 mg/dL (ref 8.9–10.3)
Chloride: 102 mmol/L (ref 98–111)
Creatinine, Ser: 0.85 mg/dL (ref 0.44–1.00)
GFR, Estimated: >60 mL/min (ref >60)
Glucose, Bld: 105 mg/dL — ABNORMAL HIGH (ref 70–99)
Potassium: 4.6 mmol/L (ref 3.5–5.1)
Sodium: 135 mmol/L (ref 135–145)

## 2022-08-17 ENCOUNTER — Encounter (HOSPITAL_COMMUNITY): Payer: Self-pay | Admitting: Physician Assistant

## 2022-08-18 DIAGNOSIS — F331 Major depressive disorder, recurrent, moderate: Secondary | ICD-10-CM | POA: Diagnosis not present

## 2022-08-20 DIAGNOSIS — R490 Dysphonia: Secondary | ICD-10-CM | POA: Diagnosis not present

## 2022-08-20 DIAGNOSIS — R059 Cough, unspecified: Secondary | ICD-10-CM | POA: Diagnosis not present

## 2022-08-25 ENCOUNTER — Encounter (HOSPITAL_COMMUNITY): Admission: RE | Payer: Self-pay | Source: Home / Self Care

## 2022-08-25 ENCOUNTER — Ambulatory Visit (HOSPITAL_COMMUNITY): Admission: RE | Admit: 2022-08-25 | Payer: Medicare Other | Source: Home / Self Care | Admitting: General Surgery

## 2022-08-25 SURGERY — REPAIR, HERNIA, VENTRAL, ROBOT-ASSISTED
Anesthesia: General

## 2022-08-31 DIAGNOSIS — F331 Major depressive disorder, recurrent, moderate: Secondary | ICD-10-CM | POA: Diagnosis not present

## 2022-09-06 DIAGNOSIS — F3342 Major depressive disorder, recurrent, in full remission: Secondary | ICD-10-CM | POA: Diagnosis not present

## 2022-09-10 DIAGNOSIS — M19012 Primary osteoarthritis, left shoulder: Secondary | ICD-10-CM | POA: Diagnosis not present

## 2022-09-10 NOTE — Patient Instructions (Addendum)
SURGICAL WAITING ROOM VISITATION  Patients having surgery or a procedure may have no more than 2 support people in the waiting area - these visitors may rotate.    Children under the age of 46 must have an adult with them who is not the patient.  Due to an increase in RSV and influenza rates and associated hospitalizations, children ages 71 and under may not visit patients in [facility] hospitals.  If the patient needs to stay at the hospital during part of their recovery, the visitor guidelines for inpatient rooms apply. Pre-op nurse will coordinate an appropriate time for 1 support person to accompany patient in pre-op.  This support person may not rotate.    Please refer to the [facility] website for the visitor guidelines for Inpatients (after your surgery is over and you are in a regular room).       Your procedure is scheduled on: 10-01-22   Report to [facility] Main Entrance    Report to admitting at        0715  AM   Call this number if you have problems the morning of surgery [phone]   Do not eat food :After Midnight.   After Midnight you may have the following liquids until _630_____ AM  DAY OF SURGERY  then nothing by mouth  Water Non-Citrus Juices (without pulp, NO RED-Apple, White grape, White cranberry) Black Coffee (NO MILK/CREAM OR CREAMERS, sugar ok)  Clear Tea (NO MILK/CREAM OR CREAMERS, sugar ok) regular and decaf                             Plain Jell-O (NO RED)                                           Fruit ices (not with fruit pulp, NO RED)                                     Popsicles (NO RED)                                                               Sports drinks like Gatorade (NO RED)                            If you have questions, please contact your surgeon's office.   FOLLOW ANY ADDITIONAL PRE OP INSTRUCTIONS YOU RECEIVED FROM YOUR SURGEON'S OFFICE!!!     Oral Hygiene is also important to reduce your risk of infection.                                     Remember - BRUSH YOUR TEETH THE MORNING OF SURGERY WITH YOUR REGULAR TOOTHPASTE  DENTURES WILL BE REMOVED PRIOR TO SURGERY PLEASE DO NOT APPLY "Poly grip" OR ADHESIVES!!!   Do NOT smoke after Midnight   Take these medicines the morning of surgery with A SIP OF WATER:  finasteride, bupropion, eye drops, tylenol if needed, Adderall if you feel you need it                               You may not have any metal on your body including hair pins, jewelry, and body piercing             Do not wear make-up, lotions, powders, perfumes/cologne, or deodorant  Do not wear nail polish including gel and S&S, artificial/acrylic nails, or any other type of covering on natural nails including finger and toenails. If you have artificial nails, gel coating, etc. that needs to be removed by a nail salon please have this removed prior to surgery or surgery may need to be canceled/ delayed if the surgeon/ anesthesia feels like they are unable to be safely monitored.   Do not shave  48 hours prior to surgery.             Do not bring valuables to the hospital. [facility] IS NOT             RESPONSIBLE   FOR VALUABLES.   Contacts, glasses, dentures or bridgework may not be worn into surgery.   Bring small overnight bag day of surgery.   DO NOT BRING YOUR HOME MEDICATIONS TO THE HOSPITAL. PHARMACY WILL DISPENSE MEDICATIONS LISTED ON YOUR MEDICATION LIST TO YOU DURING YOUR ADMISSION IN THE HOSPITAL!    Patients discharged on the day of surgery will not be allowed to drive home.  Someone NEEDS to stay with you for the first 24 hours after anesthesia.   Special Instructions: Bring a copy of your healthcare power of attorney and living will documents the day of surgery if you haven't scanned them before.              Please read over the following fact sheets you were given: IF YOU HAVE QUESTIONS ABOUT YOUR PRE-OP INSTRUCTIONS PLEASE CALL [phone]    If you test  positive for Covid or have been in contact with anyone that has tested positive in the last 10 days please notify you surgeon.    [facility] - Preparing for Surgery Before surgery, you can play an important role.  Because skin is not sterile, your skin needs to be as free of germs as possible.  You can reduce the number of germs on your skin by washing with CHG (chlorahexidine gluconate) soap before surgery.  CHG is an antiseptic cleaner which kills germs and bonds with the skin to continue killing germs even after washing. Please DO NOT use if you have an allergy to CHG or antibacterial soaps.  If your skin becomes reddened/irritated stop using the CHG and inform your nurse when you arrive at Short Stay. Do not shave (including legs and underarms) for at least 48 hours prior to the first CHG shower.  You may shave your face/neck. Please follow these instructions carefully:  1.  Shower with CHG Soap the night before surgery and the  morning of Surgery.  2.  If you choose to wash your hair, wash your hair first as usual with your  normal  shampoo.  3.  After you shampoo, rinse your hair and body thoroughly to remove the  shampoo.                           4.  Use CHG as you  would any other liquid soap.  You can apply chg directly  to the skin and wash                       Gently with a scrungie or clean washcloth.  5.  Apply the CHG Soap to your body ONLY FROM THE NECK DOWN.   Do not use on face/ open                           Wound or open sores. Avoid contact with eyes, ears mouth and genitals (private parts).                       Wash face,  Genitals (private parts) with your normal soap.             6.  Wash thoroughly, paying special attention to the area where your surgery  will be performed.  7.  Thoroughly rinse your body with warm water from the neck down.  8.  DO NOT shower/wash with your normal soap after using and rinsing off  the CHG Soap.                9.  Pat yourself dry with a  clean towel.            10.  Wear clean pajamas.            11.  Place clean sheets on your bed the night of your first shower and do not  sleep with pets. Day of Surgery : Do not apply any lotions/deodorants the morning of surgery.  Please wear clean clothes to the hospital/surgery center.  FAILURE TO FOLLOW THESE INSTRUCTIONS MAY RESULT IN THE CANCELLATION OF YOUR SURGERY PATIENT SIGNATURE_________________________________  NURSE SIGNATURE__________________________________  ________________________________________________________________________

## 2022-09-10 NOTE — Progress Notes (Signed)
Please place orders in epic pt. Is scheduled for a preop  

## 2022-09-10 NOTE — Progress Notes (Addendum)
PCP - Dorinda Hill, MD Cardiologist - Sharrell Ku ,MD  LOV 01-15-20 PULM- LOV Dr. Virl Diamond LOV 09-15-22 epic  PPM/ICD -  Device Orders -  Rep Notified -   Chest x-ray -  EKG - 08-13-22 epic Stress Test -  ECHO -  Cardiac Cath -   Sleep Study -  CPAP -   Fasting Blood Sugar -  Checks Blood Sugar _____ times a day  Blood Thinner Instructions: Aspirin Instructions:  ERAS Protcol - PRE-SURGERY Ensure or G2-    COVID vaccine -  Activity-- Anesthesia review: SVT ablation 2021 , HOH, PTSD, ADD,   Patient denies shortness of breath, fever, cough and chest pain at PAT appointment   All instructions explained to the patient, with a verbal understanding of the material. Patient agrees to go over the instructions while at home for a better understanding. Patient also instructed to self quarantine after being tested for COVID-19. The opportunity to ask questions was provided.

## 2022-09-15 ENCOUNTER — Encounter: Payer: Self-pay | Admitting: Pulmonary Disease

## 2022-09-15 ENCOUNTER — Ambulatory Visit: Payer: Medicare Other | Admitting: Pulmonary Disease

## 2022-09-15 VITALS — BP 118/72 | HR 65 | Ht 61.0 in | Wt 135.0 lb

## 2022-09-15 DIAGNOSIS — R053 Chronic cough: Secondary | ICD-10-CM | POA: Diagnosis not present

## 2022-09-15 MED ORDER — CLOTRIMAZOLE 10 MG MT TROC
10.0000 mg | Freq: Every day | OROMUCOSAL | 0 refills | Status: DC
Start: 2022-09-15 — End: 2022-10-01

## 2022-09-15 NOTE — Patient Instructions (Signed)
I will see you back in 6 months  Give [place] a call if you are still not feeling any better  You can discontinue use of Breo  Will call you in a prescription for Mycelex for the thrush  Call us with significant concerns
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
## 2022-09-17 NOTE — Progress Notes (Signed)
Sent message, via epic in basket, requesting orders in epic from surgeon.  

## 2022-09-22 ENCOUNTER — Ambulatory Visit: Payer: Self-pay | Admitting: General Surgery

## 2022-09-24 ENCOUNTER — Encounter (HOSPITAL_COMMUNITY): Payer: Self-pay

## 2022-09-24 ENCOUNTER — Encounter (HOSPITAL_COMMUNITY)
Admission: RE | Admit: 2022-09-24 | Discharge: 2022-09-24 | Disposition: A | Discharge status: Home / Self Care | Payer: Medicare Other | Source: Ambulatory Visit | Attending: Anesthesiology | Admitting: Anesthesiology

## 2022-09-24 DIAGNOSIS — I471 Supraventricular tachycardia, unspecified: Secondary | ICD-10-CM

## 2022-09-27 NOTE — Patient Instructions (Signed)
SURGICAL WAITING ROOM VISITATION  Patients having surgery or a procedure may have no more than 2 support people in the waiting area - these visitors may rotate.    Children under the age of 46 must have an adult with them who is not the patient.  Due to an increase in RSV and influenza rates and associated hospitalizations, children ages 71 and under may not visit patients in [facility] hospitals.  If the patient needs to stay at the hospital during part of their recovery, the visitor guidelines for inpatient rooms apply. Pre-op nurse will coordinate an appropriate time for 1 support person to accompany patient in pre-op.  This support person may not rotate.    Please refer to the [facility] website for the visitor guidelines for Inpatients (after your surgery is over and you are in a regular room).       Your procedure is scheduled on: 10-01-22   Report to [facility] Main Entrance    Report to admitting at        0715  AM   Call this number if you have problems the morning of surgery [phone]   Do not eat food :After Midnight.   After Midnight you may have the following liquids until _630_____ AM  DAY OF SURGERY  then nothing by mouth  Water Non-Citrus Juices (without pulp, NO RED-Apple, White grape, White cranberry) Black Coffee (NO MILK/CREAM OR CREAMERS, sugar ok)  Clear Tea (NO MILK/CREAM OR CREAMERS, sugar ok) regular and decaf                             Plain Jell-O (NO RED)                                           Fruit ices (not with fruit pulp, NO RED)                                     Popsicles (NO RED)                                                               Sports drinks like Gatorade (NO RED)                            If you have questions, please contact your surgeon's office.   FOLLOW ANY ADDITIONAL PRE OP INSTRUCTIONS YOU RECEIVED FROM YOUR SURGEON'S OFFICE!!!     Oral Hygiene is also important to reduce your risk of infection.                                     Remember - BRUSH YOUR TEETH THE MORNING OF SURGERY WITH YOUR REGULAR TOOTHPASTE  DENTURES WILL BE REMOVED PRIOR TO SURGERY PLEASE DO NOT APPLY "Poly grip" OR ADHESIVES!!!   Do NOT smoke after Midnight   Take these medicines the morning of surgery with A SIP OF WATER:  finasteride, bupropion, eye drops, tylenol if needed, Adderall if you feel you need it                               You may not have any metal on your body including hair pins, jewelry, and body piercing             Do not wear make-up, lotions, powders, perfumes/cologne, or deodorant  Do not wear nail polish including gel and S&S, artificial/acrylic nails, or any other type of covering on natural nails including finger and toenails. If you have artificial nails, gel coating, etc. that needs to be removed by a nail salon please have this removed prior to surgery or surgery may need to be canceled/ delayed if the surgeon/ anesthesia feels like they are unable to be safely monitored.   Do not shave  48 hours prior to surgery.             Do not bring valuables to the hospital. [facility] IS NOT             RESPONSIBLE   FOR VALUABLES.   Contacts, glasses, dentures or bridgework may not be worn into surgery.   Bring small overnight bag day of surgery.   DO NOT BRING YOUR HOME MEDICATIONS TO THE HOSPITAL. PHARMACY WILL DISPENSE MEDICATIONS LISTED ON YOUR MEDICATION LIST TO YOU DURING YOUR ADMISSION IN THE HOSPITAL!    Patients discharged on the day of surgery will not be allowed to drive home.  Someone NEEDS to stay with you for the first 24 hours after anesthesia.   Special Instructions: Bring a copy of your healthcare power of attorney and living will documents the day of surgery if you haven't scanned them before.              Please read over the following fact sheets you were given: IF YOU HAVE QUESTIONS ABOUT YOUR PRE-OP INSTRUCTIONS PLEASE CALL [phone]    If you test  positive for Covid or have been in contact with anyone that has tested positive in the last 10 days please notify you surgeon.    [facility] - Preparing for Surgery Before surgery, you can play an important role.  Because skin is not sterile, your skin needs to be as free of germs as possible.  You can reduce the number of germs on your skin by washing with CHG (chlorahexidine gluconate) soap before surgery.  CHG is an antiseptic cleaner which kills germs and bonds with the skin to continue killing germs even after washing. Please DO NOT use if you have an allergy to CHG or antibacterial soaps.  If your skin becomes reddened/irritated stop using the CHG and inform your nurse when you arrive at Short Stay. Do not shave (including legs and underarms) for at least 48 hours prior to the first CHG shower.  You may shave your face/neck. Please follow these instructions carefully:  1.  Shower with CHG Soap the night before surgery and the  morning of Surgery.  2.  If you choose to wash your hair, wash your hair first as usual with your  normal  shampoo.  3.  After you shampoo, rinse your hair and body thoroughly to remove the  shampoo.                           4.  Use CHG as you  would any other liquid soap.  You can apply chg directly  to the skin and wash                       Gently with a scrungie or clean washcloth.  5.  Apply the CHG Soap to your body ONLY FROM THE NECK DOWN.   Do not use on face/ open                           Wound or open sores. Avoid contact with eyes, ears mouth and genitals (private parts).                       Wash face,  Genitals (private parts) with your normal soap.             6.  Wash thoroughly, paying special attention to the area where your surgery  will be performed.  7.  Thoroughly rinse your body with warm water from the neck down.  8.  DO NOT shower/wash with your normal soap after using and rinsing off  the CHG Soap.                9.  Pat yourself dry with a  clean towel.            10.  Wear clean pajamas.            11.  Place clean sheets on your bed the night of your first shower and do not  sleep with pets. Day of Surgery : Do not apply any lotions/deodorants the morning of surgery.  Please wear clean clothes to the hospital/surgery center.  FAILURE TO FOLLOW THESE INSTRUCTIONS MAY RESULT IN THE CANCELLATION OF YOUR SURGERY PATIENT SIGNATURE_________________________________  NURSE SIGNATURE__________________________________  ________________________________________________________________________

## 2022-09-28 DIAGNOSIS — K080 Exfoliation of teeth due to systemic causes: Secondary | ICD-10-CM | POA: Diagnosis not present

## 2022-09-29 ENCOUNTER — Encounter (HOSPITAL_COMMUNITY): Payer: Self-pay

## 2022-09-29 ENCOUNTER — Encounter (HOSPITAL_COMMUNITY)
Admission: RE | Admit: 2022-09-29 | Discharge: 2022-09-29 | Disposition: A | Discharge status: Home / Self Care | Payer: Medicare Other | Source: Ambulatory Visit | Attending: General Surgery | Admitting: General Surgery

## 2022-09-29 ENCOUNTER — Other Ambulatory Visit: Payer: Self-pay

## 2022-09-29 DIAGNOSIS — F331 Major depressive disorder, recurrent, moderate: Secondary | ICD-10-CM | POA: Diagnosis not present

## 2022-09-29 DIAGNOSIS — Z01812 Encounter for preprocedural laboratory examination: Secondary | ICD-10-CM | POA: Diagnosis not present

## 2022-09-29 DIAGNOSIS — I471 Supraventricular tachycardia, unspecified: Secondary | ICD-10-CM | POA: Insufficient documentation

## 2022-09-29 LAB — BASIC METABOLIC PANEL
Anion gap: 7 (ref 5–15)
BUN: 22 mg/dL (ref 8–23)
CO2: 25 mmol/L (ref 22–32)
Calcium: 9.2 mg/dL (ref 8.9–10.3)
Chloride: 103 mmol/L (ref 98–111)
Creatinine, Ser: 0.68 mg/dL (ref 0.44–1.00)
GFR, Estimated: >60 mL/min (ref >60)
Glucose, Bld: 98 mg/dL (ref 70–99)
Potassium: 4.7 mmol/L (ref 3.5–5.1)
Sodium: 135 mmol/L (ref 135–145)

## 2022-09-29 LAB — CBC
HCT: 35.7 % — ABNORMAL LOW (ref 36.0–46.0)
Hemoglobin: 11.9 g/dL — ABNORMAL LOW (ref 12.0–15.0)
MCH: 30.2 pg (ref 26.0–34.0)
MCHC: 33.3 g/dL (ref 30.0–36.0)
MCV: 90.6 fL (ref 80.0–100.0)
Platelets: 276 10*3/uL (ref 150–400)
RBC: 3.94 MIL/uL (ref 3.87–5.11)
RDW: 13 % (ref 11.5–15.5)
WBC: 5.4 10*3/uL (ref 4.0–10.5)
nRBC: 0 % (ref 0.0–0.2)

## 2022-09-29 NOTE — Progress Notes (Addendum)
PCP - Dorinda Hill, MD Cardiologist - Sharrell Ku ,MD  LOV 01-15-20  sees on as needed now PULM- LOV Dr. Virl Diamond LOV 09-15-22 epic  PPM/ICD -  Device Orders -  Rep Notified -   Chest x-ray -  EKG - 08-13-22 epic Stress Test -  ECHO -  Cardiac Cath -   Sleep Study -  CPAP -   Fasting Blood Sugar -  Checks Blood Sugar _____ times a day  Blood Thinner Instructions: Aspirin Instructions:  ERAS Protcol - PRE-SURGERY no drink   COVID vaccine -yes up to date  Activity--Able to climb a flight of stairs and complete ADL's without SOB or CP  Anesthesia review: SVT ablation 2021 , HOH, PTSD, ADD, Resolved Resp. Infection LOV Pulm 09-15-22  Patient denies shortness of breath, fever, cough and chest pain at PAT appointment   All instructions explained to the patient, with a verbal understanding of the material. Patient agrees to go over the instructions while at home for a better understanding. Patient also instructed to self quarantine after being tested for COVID-19. The opportunity to ask questions was provided.

## 2022-09-29 NOTE — Progress Notes (Signed)
Pt. Would like to speak With Dr. Sheliah Hatch about procedure before signing consent.

## 2022-09-29 NOTE — Progress Notes (Deleted)
PCP - Dorinda Hill, MD Cardiologist - Sharrell Ku ,MD  LOV 01-15-20 PULM- LOV Dr. Virl Diamond LOV 09-15-22 epic  PPM/ICD -  Device Orders -  Rep Notified -   Chest x-ray -  EKG - 08-13-22 epic Stress Test -  ECHO -  Cardiac Cath -   Sleep Study -  CPAP -   Fasting Blood Sugar -  Checks Blood Sugar _____ times a day  Blood Thinner Instructions: Aspirin Instructions:  ERAS Protcol - PRE-SURGERY Ensure or G2-    COVID vaccine -  Activity-- Anesthesia review: SVT ablation 2021 , HOH, PTSD, ADD,   Patient denies shortness of breath, fever, cough and chest pain at PAT appointment   All instructions explained to the patient, with a verbal understanding of the material. Patient agrees to go over the instructions while at home for a better understanding. Patient also instructed to self quarantine after being tested for COVID-19. The opportunity to ask questions was provided.

## 2022-10-01 ENCOUNTER — Ambulatory Visit (HOSPITAL_BASED_OUTPATIENT_CLINIC_OR_DEPARTMENT_OTHER): Payer: Medicare Other | Admitting: Certified Registered Nurse Anesthetist

## 2022-10-01 ENCOUNTER — Ambulatory Visit (HOSPITAL_COMMUNITY): Payer: Medicare Other | Admitting: Certified Registered Nurse Anesthetist

## 2022-10-01 ENCOUNTER — Other Ambulatory Visit: Payer: Self-pay

## 2022-10-01 ENCOUNTER — Ambulatory Visit (HOSPITAL_COMMUNITY)
Admission: RE | Admit: 2022-10-01 | Discharge: 2022-10-01 | Disposition: A | Discharge status: Home / Self Care | Payer: Medicare Other | Source: Home / Self Care | Attending: General Surgery | Admitting: General Surgery

## 2022-10-01 ENCOUNTER — Encounter (HOSPITAL_COMMUNITY)
Admission: RE | Disposition: A | Discharge status: Home / Self Care | Payer: Self-pay | Source: Home / Self Care | Attending: General Surgery

## 2022-10-01 ENCOUNTER — Encounter (HOSPITAL_COMMUNITY): Payer: Self-pay | Admitting: General Surgery

## 2022-10-01 DIAGNOSIS — Z79899 Other long term (current) drug therapy: Secondary | ICD-10-CM | POA: Insufficient documentation

## 2022-10-01 DIAGNOSIS — K439 Ventral hernia without obstruction or gangrene: Secondary | ICD-10-CM | POA: Diagnosis not present

## 2022-10-01 DIAGNOSIS — K4090 Unilateral inguinal hernia, without obstruction or gangrene, not specified as recurrent: Secondary | ICD-10-CM | POA: Diagnosis not present

## 2022-10-01 DIAGNOSIS — M1990 Unspecified osteoarthritis, unspecified site: Secondary | ICD-10-CM | POA: Diagnosis not present

## 2022-10-01 DIAGNOSIS — F909 Attention-deficit hyperactivity disorder, unspecified type: Secondary | ICD-10-CM | POA: Diagnosis not present

## 2022-10-01 DIAGNOSIS — K429 Umbilical hernia without obstruction or gangrene: Secondary | ICD-10-CM | POA: Insufficient documentation

## 2022-10-01 DIAGNOSIS — F32A Depression, unspecified: Secondary | ICD-10-CM | POA: Diagnosis not present

## 2022-10-01 DIAGNOSIS — K219 Gastro-esophageal reflux disease without esophagitis: Secondary | ICD-10-CM | POA: Diagnosis not present

## 2022-10-01 DIAGNOSIS — F419 Anxiety disorder, unspecified: Secondary | ICD-10-CM | POA: Insufficient documentation

## 2022-10-01 DIAGNOSIS — Z90710 Acquired absence of both cervix and uterus: Secondary | ICD-10-CM | POA: Insufficient documentation

## 2022-10-01 HISTORY — PX: XI ROBOTIC ASSISTED VENTRAL HERNIA: SHX6789

## 2022-10-01 SURGERY — REPAIR, HERNIA, VENTRAL, ROBOT-ASSISTED
Anesthesia: General | Site: Abdomen

## 2022-10-01 MED ORDER — BUPIVACAINE-EPINEPHRINE 0.25% -1:200000 IJ SOLN
INTRAMUSCULAR | Status: AC
  Filled 2022-10-01: qty 1

## 2022-10-01 MED ORDER — FENTANYL CITRATE (PF) 100 MCG/2ML IJ SOLN
INTRAMUSCULAR | Status: AC
  Filled 2022-10-01: qty 2

## 2022-10-01 MED ORDER — LIDOCAINE HCL (PF) 2 % IJ SOLN
INTRAMUSCULAR | Status: AC
  Filled 2022-10-01: qty 5

## 2022-10-01 MED ORDER — BUPIVACAINE LIPOSOME 1.3 % IJ SUSP: 20.0000 mL | Freq: Once | INTRAMUSCULAR | Status: DC

## 2022-10-01 MED ORDER — PROPOFOL 1000 MG/100ML IV EMUL
INTRAVENOUS | Status: AC
  Filled 2022-10-01: qty 100

## 2022-10-01 MED ORDER — BUPIVACAINE LIPOSOME 1.3 % IJ SUSP
INTRAMUSCULAR | Status: AC
  Filled 2022-10-01: qty 20

## 2022-10-01 MED ORDER — DEXAMETHASONE SODIUM PHOSPHATE 10 MG/ML IJ SOLN
INTRAMUSCULAR | Status: AC
  Filled 2022-10-01: qty 1

## 2022-10-01 MED ORDER — EPHEDRINE 5 MG/ML INJ
INTRAVENOUS | Status: AC
  Filled 2022-10-01: qty 5

## 2022-10-01 MED ORDER — LACTATED RINGERS IV SOLN: INTRAVENOUS | Status: DC

## 2022-10-01 MED ORDER — PROPOFOL 10 MG/ML IV BOLUS
INTRAVENOUS | Status: DC | PRN
Start: 2022-10-01 — End: 2022-10-01
  Administered 2022-10-01: 100 mg via INTRAVENOUS
  Administered 2022-10-01: 40 mg via INTRAVENOUS

## 2022-10-01 MED ORDER — CHLORHEXIDINE GLUCONATE CLOTH 2 % EX PADS: 6.0000 | MEDICATED_PAD | Freq: Once | CUTANEOUS | Status: DC

## 2022-10-01 MED ORDER — ACETAMINOPHEN 10 MG/ML IV SOLN
INTRAVENOUS | Status: AC
  Administered 2022-10-01: 1000 mg via INTRAVENOUS
  Filled 2022-10-01: qty 100

## 2022-10-01 MED ORDER — FENTANYL CITRATE PF 50 MCG/ML IJ SOSY
PREFILLED_SYRINGE | INTRAMUSCULAR | Status: AC
  Administered 2022-10-01: 50 ug via INTRAVENOUS
  Filled 2022-10-01: qty 2

## 2022-10-01 MED ORDER — BUPIVACAINE-EPINEPHRINE 0.25% -1:200000 IJ SOLN
INTRAMUSCULAR | Status: DC | PRN
  Administered 2022-10-01: 30 mL

## 2022-10-01 MED ORDER — OXYCODONE HCL 5 MG PO TABS: 5.0000 mg | ORAL_TABLET | Freq: Four times a day (QID) | ORAL | 0 refills | Status: DC | PRN

## 2022-10-01 MED ORDER — PROPOFOL 500 MG/50ML IV EMUL
INTRAVENOUS | Status: DC | PRN
  Administered 2022-10-01: 150 ug/kg/min via INTRAVENOUS

## 2022-10-01 MED ORDER — SUGAMMADEX SODIUM 200 MG/2ML IV SOLN
INTRAVENOUS | Status: DC | PRN
  Administered 2022-10-01: 200 mg via INTRAVENOUS

## 2022-10-01 MED ORDER — FENTANYL CITRATE PF 50 MCG/ML IJ SOSY
25.0000 ug | PREFILLED_SYRINGE | INTRAMUSCULAR | Status: DC | PRN
  Administered 2022-10-01 (×2): 50 ug via INTRAVENOUS

## 2022-10-01 MED ORDER — BUPIVACAINE LIPOSOME 1.3 % IJ SUSP
INTRAMUSCULAR | Status: DC | PRN
  Administered 2022-10-01: 20 mL

## 2022-10-01 MED ORDER — DIPHENHYDRAMINE HCL 50 MG/ML IJ SOLN: 6.2500 mg | Freq: Once | INTRAMUSCULAR | Status: AC

## 2022-10-01 MED ORDER — LIDOCAINE 2% (20 MG/ML) 5 ML SYRINGE
INTRAMUSCULAR | Status: DC | PRN
  Administered 2022-10-01: 60 mg via INTRAVENOUS

## 2022-10-01 MED ORDER — CHLORHEXIDINE GLUCONATE 0.12 % MT SOLN
15.0000 mL | Freq: Once | OROMUCOSAL | Status: AC
  Administered 2022-10-01: 15 mL via OROMUCOSAL

## 2022-10-01 MED ORDER — ACETAMINOPHEN 10 MG/ML IV SOLN: 1000.0000 mg | Freq: Once | INTRAVENOUS | Status: AC

## 2022-10-01 MED ORDER — ONDANSETRON HCL 4 MG/2ML IJ SOLN
INTRAMUSCULAR | Status: DC | PRN
Start: 2022-10-01 — End: 2022-10-01
  Administered 2022-10-01: 4 mg via INTRAVENOUS

## 2022-10-01 MED ORDER — ONDANSETRON HCL 4 MG/2ML IJ SOLN
INTRAMUSCULAR | Status: AC
  Filled 2022-10-01: qty 2

## 2022-10-01 MED ORDER — SODIUM CHLORIDE (PF) 0.9 % IJ SOLN
INTRAMUSCULAR | Status: AC
  Filled 2022-10-01: qty 10

## 2022-10-01 MED ORDER — OXYCODONE HCL 5 MG PO TABS: 5.0000 mg | ORAL_TABLET | Freq: Once | ORAL | Status: AC

## 2022-10-01 MED ORDER — ROCURONIUM BROMIDE 10 MG/ML (PF) SYRINGE
PREFILLED_SYRINGE | INTRAVENOUS | Status: DC | PRN
  Administered 2022-10-01 (×3): 10 mg via INTRAVENOUS
  Administered 2022-10-01: 50 mg via INTRAVENOUS

## 2022-10-01 MED ORDER — OXYCODONE HCL 5 MG PO TABS
ORAL_TABLET | ORAL | Status: AC
  Administered 2022-10-01: 5 mg via ORAL
  Filled 2022-10-01: qty 1

## 2022-10-01 MED ORDER — ROCURONIUM BROMIDE 10 MG/ML (PF) SYRINGE
PREFILLED_SYRINGE | INTRAVENOUS | Status: AC
  Filled 2022-10-01: qty 10

## 2022-10-01 MED ORDER — DIPHENHYDRAMINE HCL 50 MG/ML IJ SOLN
INTRAMUSCULAR | Status: AC
  Administered 2022-10-01: 6.5 mg via INTRAVENOUS
  Filled 2022-10-01: qty 1

## 2022-10-01 MED ORDER — FENTANYL CITRATE PF 50 MCG/ML IJ SOSY
PREFILLED_SYRINGE | INTRAMUSCULAR | Status: AC
  Filled 2022-10-01: qty 1

## 2022-10-01 MED ORDER — ORAL CARE MOUTH RINSE: 15.0000 mL | Freq: Once | OROMUCOSAL | Status: AC

## 2022-10-01 MED ORDER — ACETAMINOPHEN 500 MG PO TABS
1000.0000 mg | ORAL_TABLET | ORAL | Status: DC
  Filled 2022-10-01: qty 2

## 2022-10-01 MED ORDER — CEFAZOLIN SODIUM-DEXTROSE 2-4 GM/100ML-% IV SOLN
2.0000 g | INTRAVENOUS | Status: AC
  Administered 2022-10-01: 2 g via INTRAVENOUS
  Filled 2022-10-01: qty 100

## 2022-10-01 MED ORDER — PROPOFOL 10 MG/ML IV BOLUS
INTRAVENOUS | Status: AC
  Filled 2022-10-01: qty 20

## 2022-10-01 MED ORDER — FENTANYL CITRATE (PF) 100 MCG/2ML IJ SOLN
INTRAMUSCULAR | Status: DC | PRN
  Administered 2022-10-01 (×3): 50 ug via INTRAVENOUS

## 2022-10-01 MED ORDER — DEXAMETHASONE SODIUM PHOSPHATE 10 MG/ML IJ SOLN
INTRAMUSCULAR | Status: DC | PRN
  Administered 2022-10-01: 5 mg via INTRAVENOUS

## 2022-10-01 MED ORDER — DEXMEDETOMIDINE HCL IN NACL 80 MCG/20ML IV SOLN
INTRAVENOUS | Status: DC | PRN
  Administered 2022-10-01: 8 ug via INTRAVENOUS

## 2022-10-01 MED ORDER — 0.9 % SODIUM CHLORIDE (POUR BTL) OPTIME
TOPICAL | Status: DC | PRN
  Administered 2022-10-01: 1000 mL

## 2022-10-01 SURGICAL SUPPLY — 55 items
ADH SKN CLS APL DERMABOND .7 (GAUZE/BANDAGES/DRESSINGS)
ANTIFOG SOL W/FOAM PAD STRL (MISCELLANEOUS) ×1
APL PRP STRL LF DISP 70% ISPRP (MISCELLANEOUS) ×1
BAG COUNTER SPONGE SURGICOUNT (BAG) ×1 IMPLANT
BAG SPNG CNTER NS LX DISP (BAG) ×1
BLADE SURG SZ11 CARB STEEL (BLADE) ×1 IMPLANT
CHLORAPREP W/TINT 26 (MISCELLANEOUS) ×1 IMPLANT
COVER TIP SHEARS 8 DVNC (MISCELLANEOUS) ×1 IMPLANT
DERMABOND ADVANCED .7 DNX12 (GAUZE/BANDAGES/DRESSINGS) IMPLANT
DEVICE TROCAR PUNCTURE CLOSURE (ENDOMECHANICALS) IMPLANT
DRAPE ARM DVNC X/XI (DISPOSABLE) ×4 IMPLANT
DRAPE COLUMN DVNC XI (DISPOSABLE) ×1 IMPLANT
DRIVER NDL LRG 8 DVNC XI (INSTRUMENTS) ×1 IMPLANT
DRIVER NDL MEGA SUTCUT DVNCXI (INSTRUMENTS) ×2 IMPLANT
DRIVER NDLE LRG 8 DVNC XI (INSTRUMENTS) ×1 IMPLANT
DRIVER NDLE MEGA SUTCUT DVNCXI (INSTRUMENTS) ×2 IMPLANT
ELECT REM PT RETURN 15FT ADLT (MISCELLANEOUS) ×1 IMPLANT
FORCEPS CADIERE DVNC XI (FORCEP) ×1 IMPLANT
GAUZE 4X4 16PLY ~~LOC~~+RFID DBL (SPONGE) ×1 IMPLANT
GAUZE SPONGE 2X2 8PLY STRL LF (GAUZE/BANDAGES/DRESSINGS) IMPLANT
GLOVE BIOGEL PI IND STRL 7.0 (GLOVE) ×2 IMPLANT
GLOVE SURG SS PI 7.0 STRL IVOR (GLOVE) ×2 IMPLANT
GOWN STRL REUS W/ TWL LRG LVL3 (GOWN DISPOSABLE) ×2 IMPLANT
GOWN STRL REUS W/ TWL XL LVL3 (GOWN DISPOSABLE) IMPLANT
GOWN STRL REUS W/TWL LRG LVL3 (GOWN DISPOSABLE) ×2
GOWN STRL REUS W/TWL XL LVL3 (GOWN DISPOSABLE)
GRASPER SUT TROCAR 14GX15 (MISCELLANEOUS) IMPLANT
GRASPER TIP-UP FEN DVNC XI (INSTRUMENTS) ×1 IMPLANT
KIT BASIN OR (CUSTOM PROCEDURE TRAY) ×1 IMPLANT
KIT TURNOVER KIT A (KITS) IMPLANT
MANIFOLD NEPTUNE II (INSTRUMENTS) ×1 IMPLANT
MARKER SKIN DUAL TIP RULER LAB (MISCELLANEOUS) ×1 IMPLANT
MESH 3DMAX MID 4X6 LT LRG (Mesh General) IMPLANT
NDL HYPO 22X1.5 SAFETY MO (MISCELLANEOUS) ×1 IMPLANT
NEEDLE HYPO 22X1.5 SAFETY MO (MISCELLANEOUS) ×1 IMPLANT
SCISSORS LAP 5X35 DISP (ENDOMECHANICALS) IMPLANT
SCISSORS MNPLR CVD DVNC XI (INSTRUMENTS) ×1 IMPLANT
SEAL UNIV 5-12 XI (MISCELLANEOUS) ×3 IMPLANT
SEALER VESSEL EXT DVNC XI (MISCELLANEOUS) IMPLANT
SET TUBE SMOKE EVAC HIGH FLOW (TUBING) ×1 IMPLANT
SOLUTION ANTFG W/FOAM PAD STRL (MISCELLANEOUS) ×1 IMPLANT
SPIKE FLUID TRANSFER (MISCELLANEOUS) ×1 IMPLANT
SUT MNCRL AB 4-0 PS2 18 (SUTURE) ×1 IMPLANT
SUT STRATAFIX 0 PDS+ CT-2 23 (SUTURE)
SUT STRATAFIX SPIRAL PDS3-0 (SUTURE) ×1 IMPLANT
SUT VIC AB 3-0 SH 27 (SUTURE) ×1
SUT VIC AB 3-0 SH 27XBRD (SUTURE) IMPLANT
SUTURE STRATFX 0 PDS+ CT-2 23 (SUTURE) ×1 IMPLANT
SYR 20ML LL LF (SYRINGE) ×2 IMPLANT
SYR CONTROL 10ML LL (SYRINGE) ×1 IMPLANT
TOWEL OR 17X26 10 PK STRL BLUE (TOWEL DISPOSABLE) ×1 IMPLANT
TOWEL OR NON WOVEN STRL DISP B (DISPOSABLE) ×1 IMPLANT
TRAY FOLEY MTR SLVR 16FR STAT (SET/KITS/TRAYS/PACK) IMPLANT
TRAY LAPAROSCOPIC (CUSTOM PROCEDURE TRAY) ×1 IMPLANT
TROCAR Z-THREAD OPTICAL 5X100M (TROCAR) ×1 IMPLANT

## 2022-10-01 NOTE — Anesthesia Postprocedure Evaluation (Signed)
Anesthesia Post Note  Patient: Kathleen Daniels  Procedure(s) Performed: XI ROBOTIC ASSISTED VENTRAL HERNIA REPAIR WITH MESH (Abdomen)     Patient location during evaluation: PACU Anesthesia Type: General Level of consciousness: awake and alert Pain management: pain level controlled Vital Signs Assessment: post-procedure vital signs reviewed and stable Respiratory status: spontaneous breathing, nonlabored ventilation and respiratory function stable Cardiovascular status: blood pressure returned to baseline and stable Postop Assessment: no apparent nausea or vomiting Anesthetic complications: no  No notable events documented.  Last Vitals:  Vitals:   10/01/22 1300 10/01/22 1315  BP: (!) 142/87 (!) 143/89  Pulse: 64 61  Resp: 13 13  Temp:  (!) 36.4 C  SpO2: 100% 100%    Last Pain:  Vitals:   10/01/22 1315  TempSrc:   PainSc: 5                  Hammond Obeirne,W. EDMOND

## 2022-10-01 NOTE — Op Note (Signed)
Preop diagnosis: left spigellian hernia  Postop diagnosis: left inguinal hernia, left 2 cm spigellian hernia, 2 cm umbilical hernia  Procedure: Robotic  Left inguinal hernia repair with mesh, robotic ventral hernia (combined size 4 cm) repair with mesh  Surgeon: Feliciana Rossetti, M.D.  Asst: none  Anesthesia: Gen.   Indications for procedure: Kathleen Daniels is a 76 y.o. female with symptoms of pain and enlarging left spigellian hernia confirmed on CT scan. After discussing risks, alternatives and benefits she decided on robotic repair and was brought to day surgery for repair.  Description of procedure: The patient was brought into the operative suite, placed supine. Anesthesia was administered with endotracheal tube. Patient was strapped in place. The patient was prepped and draped in the usual sterile fashion.  A small transverse incision was made in the right subcostal area. A 5mm trocar was used to gain access to the peritoneal cavity by optical entry technique. Pneumoperitoneum was applied with a high flow and low pressure. The laparoscope was reinserted to confirm position. A small spigellian hernia was seen in the left abdomen, there was a moderate left inguinal hernia as well. There was a small umbilical hernia.  Next Exparel:Marcaine mix was used for bilateral TAP blocks. 1 8 mm trocar was placed in the mid abdomen. 1 8 mm trocar was placed in the left mid abdomen. An additional 8 mm trocar was placed on the left lateral area. The 5 mm trocar was upsized to an 8 mm trocar. The patient was placed in trendelenberg position. The robot was docked.  A peritoneal flap was created on the right. This was continued medially to the pubic bone medially and laterally to muscles. The spigellian hernia sac was completely reduced. The hernia was 2 cm in diameter. Next, dissection was continued to reduce the inguina hernia sac out of the canal. The sac was reduced away from the round ligament.  The  spigelian fascia was closed with 0 Strattafix suture.  A large left mid weight 3D max mesh was inserted and placed to cover the spigellian and inguinal erhnias. The mesh was sutured with 2-0 vicryl medially to the lacunar ligament. One additional suture was used to secure the mesh beyond the spigellian hernia repair. The peritoneal flap was sewn back up with running 3-0 v-loc suture.    Next, attention was turned to the umbilical hernia. The hernia was 2 cm in diameter. The peritoneum was incised and the hernia sac dissected free of the hernia. The fascia was apposed with 0 Strattafix. The peritoneum was closed with running 3-0 v-loc.  The CO2 was evacuated while watching to ensure the mesh did not migrate. All skin incisions were closed with 4-0 monocryl subcu stitch. The patient awoke from anesthesia and was brought to PACU in stable condition.  Findings: left inguinal hernia, left spigellian hernia  Specimen: none  Blood loss: 20 ml  Local anesthesia: 50 ml Exparel:Marcaine mix  Complications: none  Implant: large left mid weight Bard 3D max mesh  Feliciana Rossetti, M.D. General, Bariatric, & Minimally Invasive Surgery [facility] 1:31 PM 10/01/2022

## 2022-10-01 NOTE — Anesthesia Preprocedure Evaluation (Addendum)
Anesthesia Evaluation  Patient identified by MRN, date of birth, ID band Patient awake    Reviewed: Allergy & Precautions, H&P , NPO status , Patient's Chart, lab work & pertinent test results  Airway Mallampati: II  TM Distance: >3 FB Neck ROM: Full    Dental no notable dental hx. (+) Teeth Intact, Dental Advisory Given   Pulmonary neg pulmonary ROS   Pulmonary exam normal breath sounds clear to auscultation       Cardiovascular negative cardio ROS  Rhythm:Regular Rate:Normal     Neuro/Psych   Anxiety Depression    negative neurological ROS     GI/Hepatic Neg liver ROS,GERD  Medicated,,  Endo/Other  negative endocrine ROS    Renal/GU negative Renal ROS  negative genitourinary   Musculoskeletal  (+) Arthritis , Osteoarthritis,    Abdominal   Peds  Hematology  (+) Blood dyscrasia, anemia   Anesthesia Other Findings   Reproductive/Obstetrics negative OB ROS                             Anesthesia Physical Anesthesia Plan  ASA: 2  Anesthesia Plan: General   Post-op Pain Management: Tylenol PO (pre-op)*   Induction: Intravenous  PONV Risk Score and Plan: 4 or greater and Ondansetron, Dexamethasone, Propofol infusion and TIVA  Airway Management Planned: Oral ETT  Additional Equipment:   Intra-op Plan:   Post-operative Plan: Extubation in OR  Informed Consent: I have reviewed the patients History and Physical, chart, labs and discussed the procedure including the risks, benefits and alternatives for the proposed anesthesia with the patient or authorized representative who has indicated his/her understanding and acceptance.     Dental advisory given  Plan Discussed with: CRNA  Anesthesia Plan Comments:        Anesthesia Quick Evaluation

## 2022-10-01 NOTE — H&P (Signed)
Chief Complaint: Follow Up visit (Ventral Hernia )  History of Present Illness: Kathleen Daniels is a 77 y.o. female who is seen today for hernia.  She has had minimal symptoms from the hernia but did have some soreness in the area 2 times since last visit. She denies nausea or vomiting.  Review of Systems: A complete review of systems was obtained from the patient. I have reviewed this information and discussed as appropriate with the patient. See HPI as well for other ROS.  Review of Systems  Constitutional: Negative.  HENT: Negative.  Eyes: Negative.  Respiratory: Negative.  Cardiovascular: Negative.  Gastrointestinal: Negative.  Genitourinary: Negative.  Musculoskeletal: Negative.  Skin: Negative.  Neurological: Negative.  Endo/Heme/Allergies: Negative.  Psychiatric/Behavioral: Negative.    Medical History: Past Medical History:  Diagnosis Date  Hernia of abdominal cavity  SVT (supraventricular tachycardia) 11/16/2019   There is no problem list on file for this patient.  Past Surgical History:  Procedure Laterality Date  HYSTERECTOMY 1988  SVT ablation 12/17/2019  Dr. Rosette Reveal  Removal of Breast Implants 05/2020    Allergies  Allergen Reactions  Penicillins Swelling and Hives  Swelling Difficulty breathing  Facial swelling  Oseltamivir Hallucination and Other (See Comments)  hallucinations  Hallucinations  AMS/high fever   Current Outpatient Medications on File Prior to Visit  Medication Sig Dispense Refill  acetaminophen (TYLENOL) 325 MG tablet Take 650 mg by mouth every 6 (six) hours  ALPRAZolam (XANAX) 0.5 MG tablet  benzonatate (TESSALON) 200 MG capsule Take 200 mg by mouth 3 (three) times daily as needed  buPROPion (WELLBUTRIN XL) 150 MG XL tablet  calcium carbonate 600 mg calcium (1,500 mg) Tab tablet 1 tablet with meals Orally Twice a day  cholecalciferol (VITAMIN D3) 1000 unit capsule Take by mouth  dextroamphetamine-amphetamine (ADDERALL) 20  mg tablet  finasteride (PROSCAR) 5 mg tablet  valACYclovir (VALTREX) 1000 MG tablet 2 in AM and 2 in PM PRN Orally   No current facility-administered medications on file prior to visit.   Family History  Problem Relation Age of Onset  Bladder Cancer Mother    Social History   Tobacco Use  Smoking Status Never  Smokeless Tobacco Never    Social History   Socioeconomic History  Marital status: Single  Tobacco Use  Smoking status: Never  Smokeless tobacco: Never  Vaping Use  Vaping status: Never Used  Substance and Sexual Activity  Alcohol use: Yes  Alcohol/week: 1.0 - 2.0 standard drink of alcohol  Types: 1 - 2 Standard drinks or equivalent per week  Drug use: Never   Objective:   Vitals:  07/08/22 1550  PainSc: 0-No pain   There is no height or weight on file to calculate BMI.  Physical Exam Constitutional:  Appearance: Normal appearance.  HENT:  Head: Normocephalic and atraumatic.  Pulmonary:  Effort: Pulmonary effort is normal.  Musculoskeletal:  General: Normal range of motion.  Cervical back: Normal range of motion.  Neurological:  General: No focal deficit present.  Mental Status: She is alert and oriented to person, place, and time. Mental status is at baseline.  Psychiatric:  Mood and Affect: Mood normal.  Behavior: Behavior normal.  Thought Content: Thought content normal.     Assessment and Plan:   Diagnoses and all orders for this visit:  Ventral hernia without obstruction or gangrene    We discussed again the details of the surgery and plan for robotic repair with mesh.   She has high anxiety about general anesthesia  as in the past it has led to "mind fog". She did tolerate twilight well during a cardiac ablation

## 2022-10-01 NOTE — Progress Notes (Signed)
Patient in phase 2 discharge area instructions gone over with patient 2 times she is asking me to keep repeating till she remembers I explained we will go over instructions with her son over the phone and they are written down for her when the anesthesia has fully worn off tomarrow she could review them with her son at home. She was not happy with my response I explained this to the son told me not to take it personal he understood she had surgery today and would not remember everything we went over with her that's why patient's are given written discharge instructions and staff goes over the instructions with family over the phone.She said she felt rushed it should be this evening before she went home. I explained she has actually been here 2 hours and most pt's don;t stay that long we were taking our time with her so she did not feel rushed. Incentive spirometer gone with patient as well,she was given a handout on how to use IS at home.

## 2022-10-01 NOTE — Transfer of Care (Signed)
Immediate Anesthesia Transfer of Care Note  Patient: Kathleen Daniels  Procedure(s) Performed: XI ROBOTIC ASSISTED VENTRAL HERNIA REPAIR WITH MESH (Abdomen)  Patient Location: PACU  Anesthesia Type:General  Level of Consciousness: awake, alert , oriented, and patient cooperative  Airway & Oxygen Therapy: Patient Spontanous Breathing and Patient connected to face mask oxygen  Post-op Assessment: Report given to RN and Post -op Vital signs reviewed and stable  Post vital signs: Reviewed and stable  Last Vitals:  Vitals Value Taken Time  BP    Temp    Pulse    Resp    SpO2      Last Pain:  Vitals:   10/01/22 0757  TempSrc:   PainSc: 0-No pain         Complications: No notable events documented.

## 2022-10-01 NOTE — Anesthesia Procedure Notes (Addendum)
Procedure Name: Intubation Date/Time: 10/01/2022 10:12 AM  Performed by: Bishop Limbo, CRNAPre-anesthesia Checklist: Patient identified, Emergency Drugs available, Suction available and Patient being monitored Patient Re-evaluated:Patient Re-evaluated prior to induction Oxygen Delivery Method: Circle System Utilized Preoxygenation: Pre-oxygenation with 100% oxygen Induction Type: IV induction Ventilation: Mask ventilation without difficulty Laryngoscope Size: Mac and 3 Grade View: Grade I Tube type: Oral Tube size: 7.0 mm Number of attempts: 1 Airway Equipment and Method: Stylet Placement Confirmation: ETT inserted through vocal cords under direct vision, positive ETCO2 and breath sounds checked- equal and bilateral Secured at: 22 cm Tube secured with: Tape Dental Injury: Teeth and Oropharynx as per pre-operative assessment

## 2022-10-03 ENCOUNTER — Encounter (HOSPITAL_COMMUNITY): Payer: Self-pay | Admitting: General Surgery

## 2022-10-13 DIAGNOSIS — L308 Other specified dermatitis: Secondary | ICD-10-CM | POA: Diagnosis not present

## 2022-10-13 DIAGNOSIS — L509 Urticaria, unspecified: Secondary | ICD-10-CM | POA: Diagnosis not present

## 2022-10-18 DIAGNOSIS — L509 Urticaria, unspecified: Secondary | ICD-10-CM | POA: Diagnosis not present

## 2022-10-18 DIAGNOSIS — L282 Other prurigo: Secondary | ICD-10-CM | POA: Diagnosis not present

## 2022-10-19 ENCOUNTER — Encounter: Payer: Self-pay | Admitting: Internal Medicine

## 2022-10-19 ENCOUNTER — Ambulatory Visit: Payer: Medicare Other | Admitting: Internal Medicine

## 2022-10-19 VITALS — BP 140/80 | HR 75 | Temp 97.7°F | Resp 18 | Ht 61.0 in | Wt 134.0 lb

## 2022-10-19 DIAGNOSIS — Z880 Allergy status to penicillin: Secondary | ICD-10-CM | POA: Diagnosis not present

## 2022-10-19 DIAGNOSIS — L501 Idiopathic urticaria: Secondary | ICD-10-CM

## 2022-10-19 DIAGNOSIS — F331 Major depressive disorder, recurrent, moderate: Secondary | ICD-10-CM | POA: Diagnosis not present

## 2022-10-19 DIAGNOSIS — Z9889 Other specified postprocedural states: Secondary | ICD-10-CM | POA: Diagnosis not present

## 2022-10-19 MED ORDER — MONTELUKAST SODIUM 10 MG PO TABS: 10.0000 mg | ORAL_TABLET | Freq: Every day | ORAL | 5 refills | Status: DC

## 2022-10-19 MED ORDER — OMALIZUMAB 150 MG/ML ~~LOC~~ SOSY
300.0000 mg | PREFILLED_SYRINGE | Freq: Once | SUBCUTANEOUS | Status: AC
Start: 2022-10-19 — End: 2022-10-19
  Administered 2022-10-19: 300 mg via SUBCUTANEOUS

## 2022-10-19 NOTE — Progress Notes (Unsigned)
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 Sample was given in office. Frequency: every 4 weeks Epi-Pen:{CHL AMB ALG EPI-PEN:[phone]} Consent signed and patient instructions given.   Modesto Charon 10/19/2022, 5:11 PM

## 2022-10-19 NOTE — Patient Instructions (Addendum)
Chronic Idiopathic Urticaria: - this is defined as hives lasting more than 6 weeks without an identifiable trigger - hives can be from a number of different sources including infections, allergies, vibration, temperature, pressure among many others other possible causes - often an identifiable cause is not determined - some potential triggers include: stress, illness, NSAIDs, aspirin, hormonal changes - you do not have any red flag symptoms to make [place] concerned about secondary causes of hives, but we will screen for these for reassurance with: CBC w diff, CMP, tryptase, TSH, hive panel, alpha-gal panel, inflammatory markers - approximately 50% of patients with chronic hives can have some associated swelling of the face/lips/eyelids (this is not a cause for alarm and does not typically progress onto systemic allergic reactions)  Therapy Plan:  - Start Allegra (fexofenadine) 180mg  twice daily - this can be bought cheaply on [employer], generic is okay  - Continue pepcid 20mg  twice daily  - Start Singulair 10mg  daily  - Prednisone taper: take 2 tabs tomorrow, take 1 tab the next day then stop  - Start xolair 300mg  SQ every 4 weeks,   -Tammy our biologic coordinator will reach out to you about the approval process   - Consent obtained today, allergy action plan and epipen prescribed today    Follow up: in clinic in 3 months Follow up: in shot room in 4 weeks for next xolair injection   Thank you so much for letting me partake in your care today.  Don't hesitate to reach out if you have any additional concerns!  Ferol Luz, MD  [facility] [place], [place]
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
## 2022-10-20 MED ORDER — EPINEPHRINE 0.3 MG/0.3ML IJ SOAJ: 0.3000 mg | INTRAMUSCULAR | 1 refills | Status: DC | PRN

## 2022-10-21 ENCOUNTER — Telehealth: Payer: Self-pay | Admitting: *Deleted

## 2022-10-21 NOTE — Telephone Encounter (Signed)
Reached out to patient regarding Xolair and patient assistance program since patient has medicare and affordability issue for same. Will be in touch regarding status after I mail her consent and submit

## 2022-10-21 NOTE — Telephone Encounter (Signed)
-----   Message from [facility] Teodora Medici sent at 10/19/2022  5:20 PM EDT ----- Regarding: New Start Pt started on Xolair 300 mg every 4 weeks for idiopathic Urticaria.

## 2022-10-26 ENCOUNTER — Ambulatory Visit: Payer: Medicare Other | Admitting: Pulmonary Disease

## 2022-11-14 NOTE — Progress Notes (Signed)
I reviewed the bloodwork.   Thyroid level was slightly low. She should follow up  with PCP.  Environmental allergies showed los positives to grass, weed and cockroach.   Blood count, kidney function, liver function, electrolytes, , autoimmune screener, inflammation markers, chronic urticaria index (checks for autoantibodies that trigger mast cells), tryptase (checks for mast cell issues) and alpha gal (checks for red meat allergy) were all normal which is great.

## 2022-11-16 DIAGNOSIS — K080 Exfoliation of teeth due to systemic causes: Secondary | ICD-10-CM | POA: Diagnosis not present

## 2022-11-18 NOTE — Telephone Encounter (Signed)
I had submitted patient to patient assistance program for free Xolair. Unfortunately, due to changes in their program patient will not qualify for PAP due to income and max out of pocket. The only other options I have at this time for her and some other patients that have been denied is to get approval and see how much out of pocket this may be to patient of course it will be pricey unless the have reached max out of pocket and are under catastrophic coverage for remainder of year. Can advise patient they can change their plan to [facility] and supplement so we can do buy and bill or they can wait until changes for [facility] coming next year that has $2000 max out of pocket for patient and can sign up for prescription payment plan with the advantage plan where they can [facility] even payments to plan for the cost of their meds upt to $2000 out of pocket. Wish I had another work around but since Xolair has changed they do not have any other avenues

## 2022-11-19 NOTE — Telephone Encounter (Signed)
Thanks for updating me!

## 2022-11-24 ENCOUNTER — Other Ambulatory Visit (HOSPITAL_COMMUNITY): Payer: Self-pay

## 2022-11-24 ENCOUNTER — Other Ambulatory Visit: Payer: Self-pay

## 2022-11-24 MED ORDER — XOLAIR 300 MG/2ML ~~LOC~~ SOSY
300.0000 mg | PREFILLED_SYRINGE | SUBCUTANEOUS | 11 refills | Status: DC
Start: 2022-11-24 — End: 2022-11-24
  Filled 2022-11-24: qty 2, 28d supply, fill #0

## 2022-11-24 NOTE — Addendum Note (Signed)
Addended by: Devoria Glassing on: 11/24/2022 09:36 AM   Modules accepted: Orders

## 2022-11-24 NOTE — Telephone Encounter (Signed)
Called patient and discussed options with Medicare and changing her plan for next year. She wants to discuss same with Dr Marlynn Perking if she really needs same before deciding

## 2022-11-26 DIAGNOSIS — F331 Major depressive disorder, recurrent, moderate: Secondary | ICD-10-CM | POA: Diagnosis not present

## 2022-12-03 IMAGING — DX DG PELVIS 3+V JUDET
3 series · 3 of 3 positions shown · non-contrast
Comparison: December 21, 2020.

CLINICAL DATA: Acetabular fracture (HCC) [id number] (F1P-OZ-CM)

EXAM:
JUDET PELVIS - 3+ VIEW

[pelvis ap]
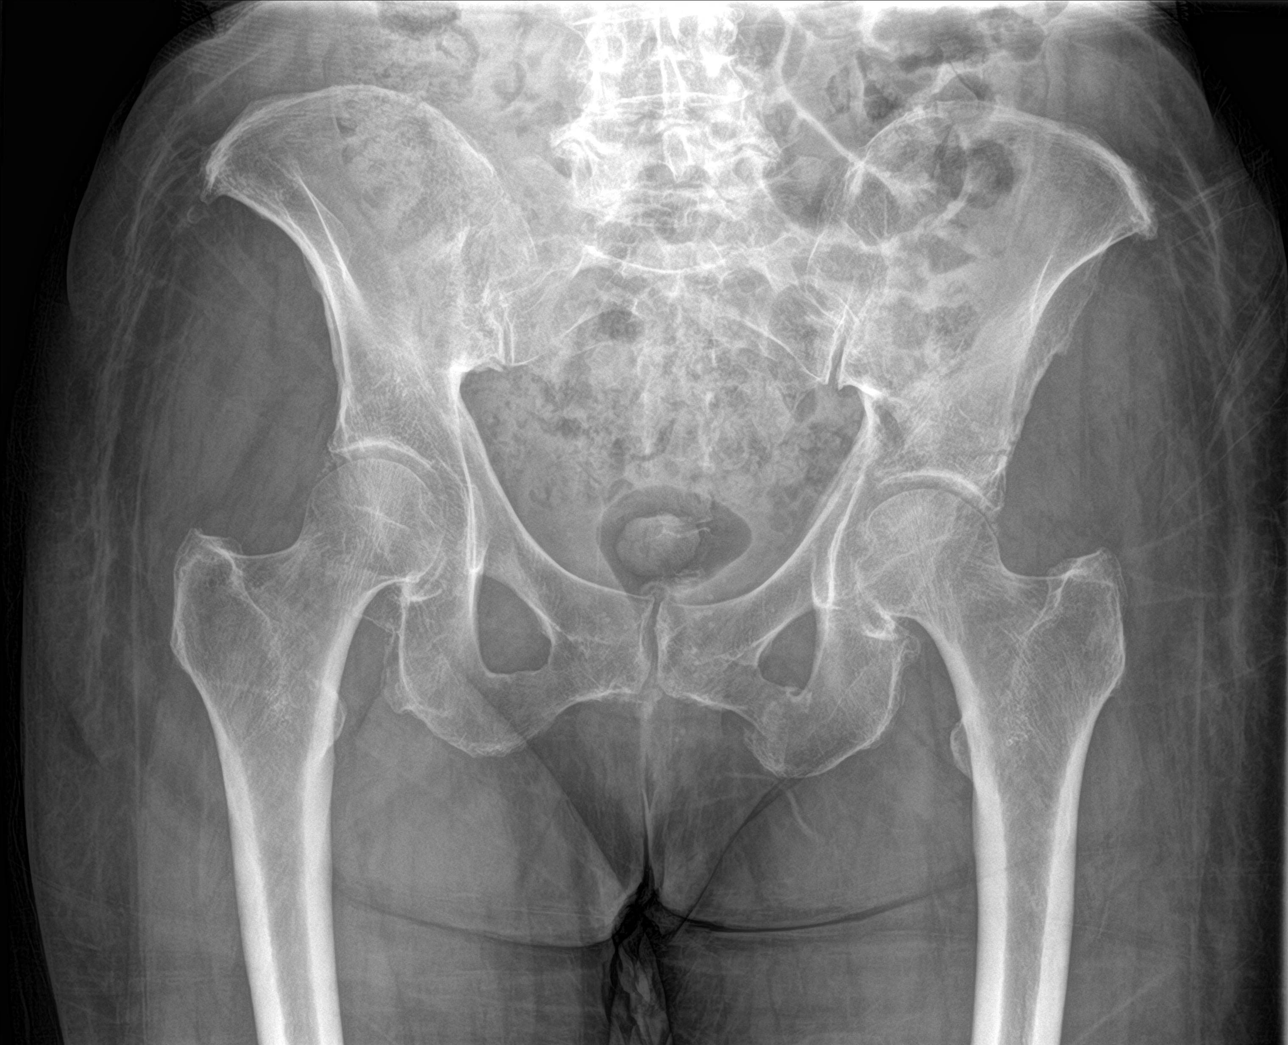

[pelvis obl (1 of 2)]
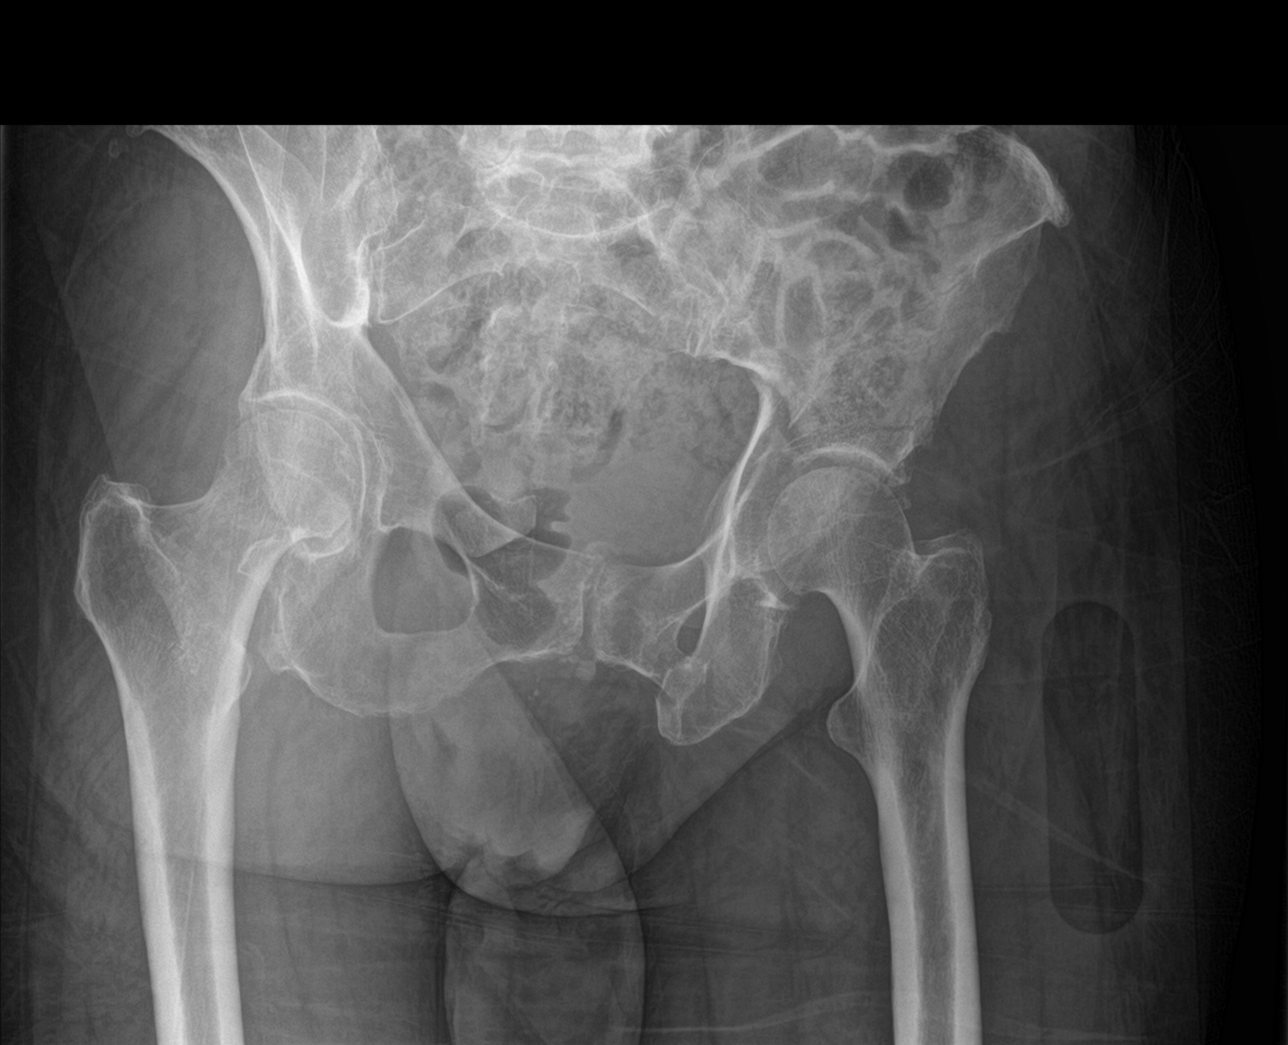

[pelvis obl (2 of 2)]
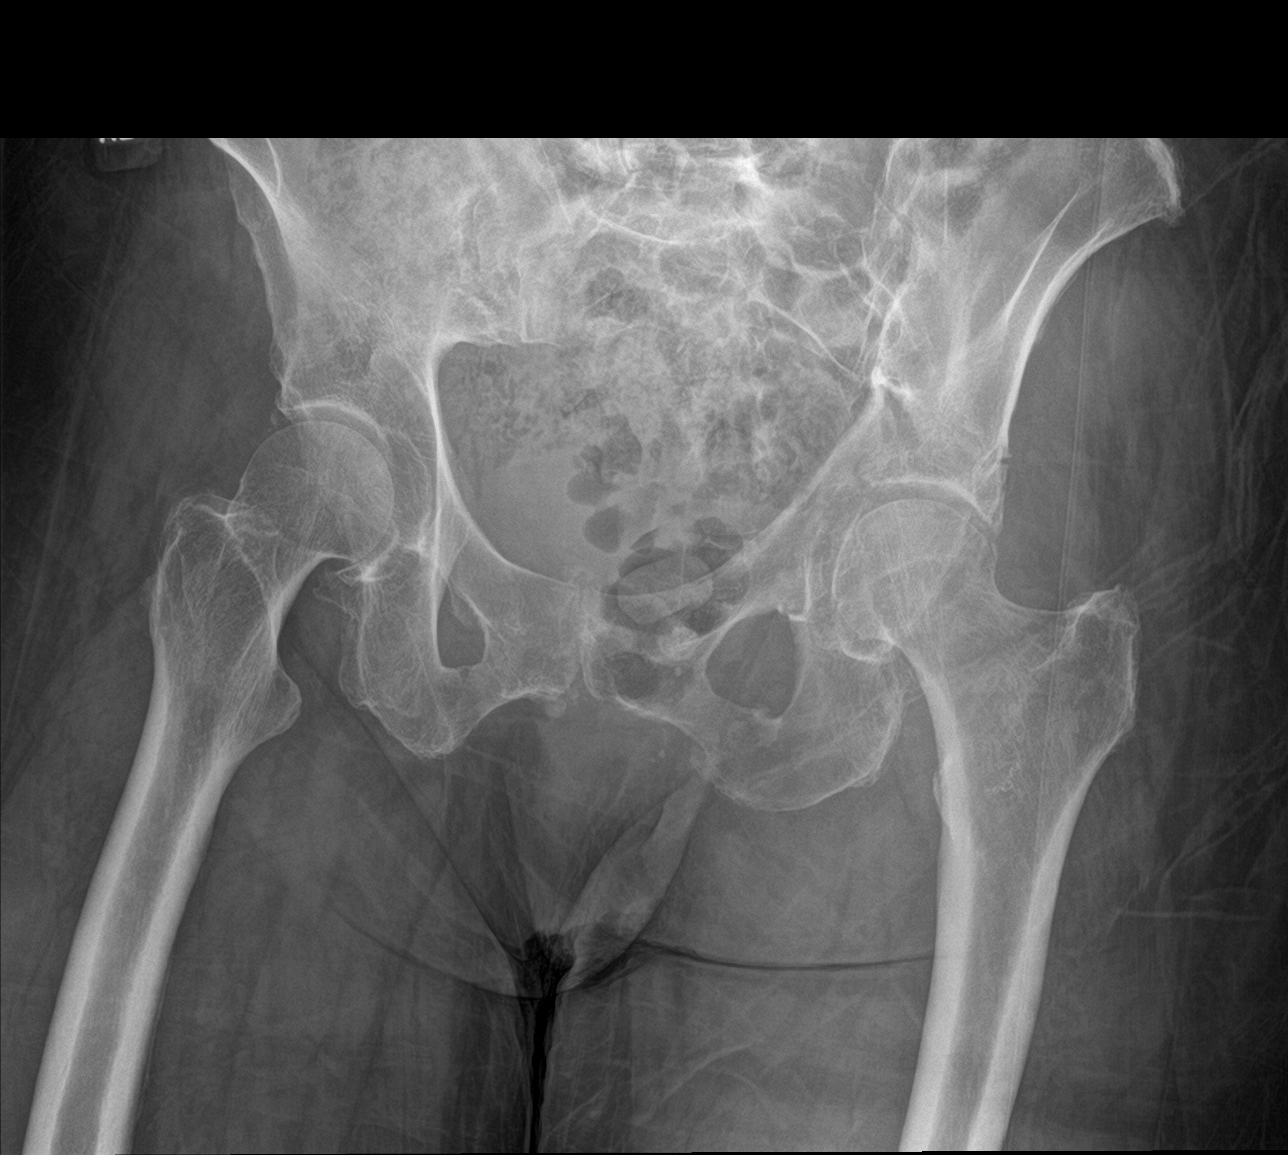

[3 of 3 positions shown; findings below may reference images not displayed]

FINDINGS: Similar alignment of a slightly displaced left acetabular fracture,
extending into left iliac bone. Similar alignment of a mildly
displaced left inferior pubic ramus fracture. No new fractures
identified. Similar left greater than right hip degenerative change
in lower lumbar degenerative change.
IMPRESSION: Similar alignment of left acetabular and inferior pubic ramus
fractures.

## 2022-12-21 ENCOUNTER — Ambulatory Visit: Payer: Medicare Other | Admitting: Pulmonary Disease

## 2022-12-23 DIAGNOSIS — K080 Exfoliation of teeth due to systemic causes: Secondary | ICD-10-CM | POA: Diagnosis not present

## 2022-12-29 DIAGNOSIS — F331 Major depressive disorder, recurrent, moderate: Secondary | ICD-10-CM | POA: Diagnosis not present

## 2023-01-05 DIAGNOSIS — K080 Exfoliation of teeth due to systemic causes: Secondary | ICD-10-CM | POA: Diagnosis not present

## 2023-01-07 DIAGNOSIS — H5213 Myopia, bilateral: Secondary | ICD-10-CM | POA: Diagnosis not present

## 2023-01-19 DIAGNOSIS — F331 Major depressive disorder, recurrent, moderate: Secondary | ICD-10-CM | POA: Diagnosis not present

## 2023-01-21 ENCOUNTER — Other Ambulatory Visit: Payer: Self-pay

## 2023-01-21 ENCOUNTER — Ambulatory Visit: Payer: Medicare Other | Admitting: Internal Medicine

## 2023-01-21 ENCOUNTER — Encounter: Payer: Self-pay | Admitting: Internal Medicine

## 2023-01-21 VITALS — BP 134/72 | HR 77 | Temp 98.0°F | Resp 12 | Wt 136.8 lb

## 2023-01-21 DIAGNOSIS — Z880 Allergy status to penicillin: Secondary | ICD-10-CM

## 2023-01-21 DIAGNOSIS — L501 Idiopathic urticaria: Secondary | ICD-10-CM | POA: Diagnosis not present

## 2023-01-21 MED ORDER — MONTELUKAST SODIUM 10 MG PO TABS: 10.0000 mg | ORAL_TABLET | Freq: Every day | ORAL | 5 refills | Status: DC

## 2023-01-21 MED ORDER — FAMOTIDINE 20 MG PO TABS: 20.0000 mg | ORAL_TABLET | Freq: Two times a day (BID) | ORAL | 5 refills | Status: DC

## 2023-01-21 NOTE — Patient Instructions (Addendum)
Chronic Idiopathic Urticaria: controlled    Therapy Plan for furture flares  - Start Allegra (fexofenadine) 180mg  twice daily  - Start  pepcid 20mg  twice daily  - Start Singulair 10mg  daily  - Continue to avoid ALL NSAIDS if possible   - if need one try ibuprofen first.    Follow up: 6 month   Thank you so much for letting me partake in your care today.  Don't hesitate to reach out if you have any additional concerns!  Ferol Luz, MD  [facility] [place] [place]
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
## 2023-02-01 ENCOUNTER — Telehealth: Payer: Self-pay | Admitting: Physical Medicine and Rehabilitation

## 2023-02-01 NOTE — Telephone Encounter (Signed)
Patient called and said that someone referred Dr. Alvester Morin. She needs to make an appointment for Lower Back pain. CB#[phone]

## 2023-02-02 ENCOUNTER — Encounter: Payer: Self-pay | Admitting: Podiatry

## 2023-02-02 ENCOUNTER — Ambulatory Visit: Payer: Medicare Other | Admitting: Podiatry

## 2023-02-02 VITALS — Ht 61.0 in | Wt 136.8 lb

## 2023-02-02 DIAGNOSIS — M2041 Other hammer toe(s) (acquired), right foot: Secondary | ICD-10-CM

## 2023-02-02 DIAGNOSIS — M2011 Hallux valgus (acquired), right foot: Secondary | ICD-10-CM | POA: Diagnosis not present

## 2023-02-02 DIAGNOSIS — M2012 Hallux valgus (acquired), left foot: Secondary | ICD-10-CM | POA: Diagnosis not present

## 2023-02-02 DIAGNOSIS — M2042 Other hammer toe(s) (acquired), left foot: Secondary | ICD-10-CM

## 2023-02-02 NOTE — Progress Notes (Signed)
Subjective:   Patient ID: Kathleen Daniels, female   DOB: 77 y.o.   MRN: [id number]   HPI Patient states her left foot is hurting more and she knows she needs surgery on it.  Patient states she would like to do in January looking for temporary correction until then   ROS      Objective:  Physical Exam  Neurovascular status intact health good good digital perfusion with severe hammertoe deformity second left rigid contracture keratotic dorsal lesion with bunion deformity with pressure of the hallux against the second toe     Assessment:  Significant structural deformity left foot with bunion deformity hammertoe deformity rigid contracture     Plan:  H&P reviewed recommended at this point correction of deformity explained at great length no guarantee we can get full correction and we will not be able to get this completely corrected.  Patient wants surgery and at this point I allowed her to read the consent form going over alternative treatments complications associated with surgery and she will have distal osteotomy possible Akin digital fusion digit to left.  I reviewed everything with her dispensed air fracture walker for the postoperative.  I want her to get used to before surgery properly fitted to her lower leg all questions answered

## 2023-02-08 ENCOUNTER — Encounter: Payer: Self-pay | Admitting: Physical Medicine and Rehabilitation

## 2023-02-08 ENCOUNTER — Ambulatory Visit (INDEPENDENT_AMBULATORY_CARE_PROVIDER_SITE_OTHER): Payer: Medicare Other | Admitting: Physical Medicine and Rehabilitation

## 2023-02-08 VITALS — BP 152/74 | HR 73

## 2023-02-08 DIAGNOSIS — M5441 Lumbago with sciatica, right side: Secondary | ICD-10-CM

## 2023-02-08 DIAGNOSIS — G8929 Other chronic pain: Secondary | ICD-10-CM | POA: Diagnosis not present

## 2023-02-08 DIAGNOSIS — M48062 Spinal stenosis, lumbar region with neurogenic claudication: Secondary | ICD-10-CM | POA: Diagnosis not present

## 2023-02-08 DIAGNOSIS — M47816 Spondylosis without myelopathy or radiculopathy, lumbar region: Secondary | ICD-10-CM

## 2023-02-08 DIAGNOSIS — M5416 Radiculopathy, lumbar region: Secondary | ICD-10-CM | POA: Diagnosis not present

## 2023-02-08 DIAGNOSIS — M546 Pain in thoracic spine: Secondary | ICD-10-CM
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
## 2023-02-08 NOTE — Progress Notes (Signed)
Functional Pain Scale - descriptive words and definitions  Distracting (5)    Aware of pain/able to complete some ADL's but limited by pain/sleep is affected and active distractions are only slightly useful. Moderate range order  Average Pain 4  Low back pain that hurts to walk, sit, and stand. Hurts more when standing. Back pain does radiate down her right leg while walking, but pain is on/off.  No numbness or tingling.  Takes Tylenol

## 2023-02-09 ENCOUNTER — Telehealth: Payer: Self-pay | Admitting: Physical Medicine and Rehabilitation

## 2023-02-09 NOTE — Telephone Encounter (Signed)
Patient called and said she thinks its her pain in her lower back is the SI joint is what's causing it. Her MRI is December 18. CB#[phone]

## 2023-02-16 ENCOUNTER — Encounter: Payer: Self-pay | Admitting: Physical Medicine and Rehabilitation

## 2023-02-16 DIAGNOSIS — F331 Major depressive disorder, recurrent, moderate: Secondary | ICD-10-CM | POA: Diagnosis not present

## 2023-02-18 ENCOUNTER — Telehealth: Payer: Self-pay | Admitting: Podiatry

## 2023-02-18 NOTE — Telephone Encounter (Signed)
DOS-03/22/2023  [place] [facility] [id number] Ralene Bathe [id number] [place] REPAIR 2ND [id number]  [employer] EFFECTIVE DATE- 03/08/22  DEDUCTIBLE-$0.00 WITH REMAINING $0.00 OOP-$4900.00 WITH REMAINING $4,146.85   SPOKE WITH ELIZABETH T. FROM [employer] AND SHE STATED THAT PRIOR AUTH IS NOT REQUIRED FOR CPT CODES [id number] AND [id number].  CALL REFERENCE #: [id number] ELIZABETH T. 02/18/23 @ 8:59 AM EST

## 2023-02-23 ENCOUNTER — Ambulatory Visit
Admission: RE | Admit: 2023-02-23 | Discharge: 2023-02-23 | Disposition: A | Discharge status: Home / Self Care | Payer: Medicare Other | Source: Ambulatory Visit | Attending: Physical Medicine and Rehabilitation | Admitting: Physical Medicine and Rehabilitation

## 2023-02-23 DIAGNOSIS — M48062 Spinal stenosis, lumbar region with neurogenic claudication: Secondary | ICD-10-CM

## 2023-02-23 DIAGNOSIS — M47816 Spondylosis without myelopathy or radiculopathy, lumbar region: Secondary | ICD-10-CM | POA: Diagnosis not present

## 2023-02-23 DIAGNOSIS — M5416 Radiculopathy, lumbar region: Secondary | ICD-10-CM

## 2023-02-23 DIAGNOSIS — M5126 Other intervertebral disc displacement, lumbar region: Secondary | ICD-10-CM | POA: Diagnosis not present

## 2023-02-23 DIAGNOSIS — G8929 Other chronic pain: Secondary | ICD-10-CM

## 2023-03-15 DIAGNOSIS — F331 Major depressive disorder, recurrent, moderate: Secondary | ICD-10-CM | POA: Diagnosis not present

## 2023-03-17 ENCOUNTER — Telehealth: Payer: Self-pay | Admitting: Physical Medicine and Rehabilitation

## 2023-03-17 ENCOUNTER — Other Ambulatory Visit: Payer: Self-pay | Admitting: Physical Medicine and Rehabilitation

## 2023-03-17 DIAGNOSIS — M48062 Spinal stenosis, lumbar region with neurogenic claudication: Secondary | ICD-10-CM

## 2023-03-17 DIAGNOSIS — M5416 Radiculopathy, lumbar region: Secondary | ICD-10-CM

## 2023-03-17 NOTE — Telephone Encounter (Signed)
 I attempted to call patient this afternoon to discuss recent lumbar MRI imaging. There is moderate to severe spinal canal stenosis at the level of L4-L5. Based on her pain pattern and symptoms we elected to perform right L5 transforaminal epidural steroid injection. I will go ahead and place order for injection, will try to call patient back.

## 2023-03-18 ENCOUNTER — Ambulatory Visit: Payer: Medicare Other | Admitting: Physical Medicine and Rehabilitation

## 2023-03-18 ENCOUNTER — Encounter: Payer: Self-pay | Admitting: Physical Medicine and Rehabilitation

## 2023-03-18 DIAGNOSIS — M5416 Radiculopathy, lumbar region: Secondary | ICD-10-CM | POA: Diagnosis not present

## 2023-03-18 DIAGNOSIS — M5441 Lumbago with sciatica, right side: Secondary | ICD-10-CM | POA: Diagnosis not present

## 2023-03-18 DIAGNOSIS — G8929 Other chronic pain: Secondary | ICD-10-CM | POA: Diagnosis not present

## 2023-03-18 DIAGNOSIS — M4800 Spinal stenosis, site unspecified: Secondary | ICD-10-CM | POA: Diagnosis not present
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
## 2023-03-21 ENCOUNTER — Ambulatory Visit: Payer: Medicare Other | Admitting: Pulmonary Disease

## 2023-03-21 ENCOUNTER — Encounter: Payer: Self-pay | Admitting: Pulmonary Disease

## 2023-03-21 VITALS — BP 118/72 | HR 82 | Temp 97.9°F | Ht 61.0 in | Wt 134.6 lb

## 2023-03-21 DIAGNOSIS — R053 Chronic cough: Secondary | ICD-10-CM | POA: Diagnosis not present

## 2023-03-21 MED ORDER — OXYCODONE-ACETAMINOPHEN 10-325 MG PO TABS
1.0000 | ORAL_TABLET | ORAL | 0 refills | Status: DC | PRN
Start: 2023-03-21 — End: 2023-06-08

## 2023-03-21 MED ORDER — ONDANSETRON HCL 4 MG PO TABS: 4.0000 mg | ORAL_TABLET | Freq: Three times a day (TID) | ORAL | 0 refills | Status: DC | PRN

## 2023-03-21 NOTE — Progress Notes (Signed)
 Kathleen Daniels    [id number]    21-Mar-1945  Primary Care Physician:Wile, Leita DEL, MD  Referring Physician: Stephane Leita DEL, MD [address] 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   ALPRAZolam  (XANAX ) 0.5 MG tablet Take 0.75 mg by mouth at bedtime.   ARTIFICIAL TEAR OP Place 1 drop into both eyes daily.   B Complex Vitamins (VITAMIN B-COMPLEX PO) Take 1 tablet by mouth daily.   buPROPion  (WELLBUTRIN  XL) 150 MG 24 hr tablet Take 300 mg by mouth daily.   Calcium Carb-Cholecalciferol (CALCIUM 600 + D PO) Take 1 tablet by mouth 2 (two) times daily.   finasteride  (PROSCAR ) 5 MG tablet Take 2.5 mg by mouth daily.    ondansetron  (ZOFRAN ) 4 MG tablet Take 1 tablet (4 mg total) by mouth every 8 (eight) hours as needed for nausea or vomiting.   oxyCODONE -acetaminophen  (PERCOCET) 10-325 MG tablet Take 1 tablet by mouth every 4 (four) hours as needed for pain.   valACYclovir (VALTREX) 1000 MG tablet Take 2 mg by mouth 2 (two) times daily as needed (fever blisters).   [DISCONTINUED]  omalizumab  (XOLAIR ) 300 MG/2  ML prefilled syringe Inject 300 mg into the skin every 28 (twenty-eight) days.   No facility-administered encounter medications on file as of 03/21/2023.    Allergies as of 03/21/2023 - Review Complete 03/21/2023  Allergen Reaction Noted   Penicillins Hives and Swelling 02/24/2012   Cefazolin  Hives and Itching 10/12/2022   Tamiflu [oseltamivir phosphate] Other (See Comments) 05/16/2014   Tetanus-diphtheria toxoids td Other (See Comments) 03/21/2023    Past Medical History:  Diagnosis Date   ADD (attention deficit disorder)    ADHD    Alopecia    Anemia    when had menstrual cycle   Arthritis    Complication of anesthesia    mind fog with general anesthesia   Complication of anesthesia    was awake during her ablation.   Complication of anesthesia    leads to depression   Depression    s/p ECT   Dysrhythmia    SVT with ablation   GERD (gastroesophageal reflux disease)    Hernia of abdominal cavity    SVT (supraventricular tachycardia) (HCC)    Had an ablation    Past Surgical History:  Procedure Laterality Date   ABDOMINAL HYSTERECTOMY     BREAST ENHANCEMENT SURGERY     BREAST IMPLANT REMOVAL     MOUTH SURGERY  2024   tooth removal with post for implant  placed   SVT ABLATION N/A 12/17/2019   Procedure: SVT ABLATION;  Surgeon: Waddell Danelle ORN, MD;  Location: [facility] INVASIVE CV LAB;  Service: Cardiovascular;  Laterality: N/A;   XI ROBOTIC ASSISTED VENTRAL HERNIA N/A 10/01/2022   Procedure: XI ROBOTIC ASSISTED VENTRAL HERNIA REPAIR WITH MESH;  Surgeon: Kinsinger, Herlene Righter, MD;  Location: [facility] OR[facility];  Service: General;  Laterality: N/A;    Family History  Problem Relation Age of Onset   Bladder Cancer Mother    Diabetes Father    Alzheimer's disease Father    Colon cancer Neg Hx    Pancreatic cancer Neg Hx    Stomach cancer Neg Hx     Social History   Socioeconomic History   Marital status: Single    Spouse name: Not on file    Number of children: 2   Years of education: Not on file   Highest education level: Not on file  Occupational History   Occupation: retired  Tobacco Use   Smoking status: Never    Passive exposure: Never   Smokeless tobacco: Never  Vaping Use   Vaping status: Never Used  Substance and Sexual Activity   Alcohol  use: Yes    Alcohol /week: 1.0 standard drink of alcohol     Types: 1 Glasses of wine per week    Comment: occ   Drug use: No   Sexual activity: Not Currently  Other Topics Concern   Not on file  Social History Narrative   ** Merged History Encounter **       Social Drivers of Corporate Investment Banker Strain: Not on file  Food Insecurity: Not on file  Transportation Needs: Not on file  Physical Activity: Not on file  Stress: Not on file  Social Connections: Not on file  Intimate Partner Violence: Not on file    Review of Systems  Respiratory:  Negative for cough.   Allergic/Immunologic: Negative for immunocompromised state.  All other systems reviewed and are negative.   Vitals:   03/21/23 1530  BP: 118/72  Pulse: 82  Temp: 97.9 F (36.6 C)  SpO2: 98%     Physical Exam Constitutional:      Appearance: Normal appearance.  HENT:     Head: Normocephalic.     Mouth/Throat:     Mouth: Mucous membranes are moist.  Cardiovascular:     Rate and Rhythm: Normal rate and regular rhythm.     Heart sounds: No murmur heard.    No friction rub.  Pulmonary:     Effort: No respiratory distress.     Breath sounds: No stridor. No wheezing or rhonchi.  Musculoskeletal:     Cervical back: No rigidity or tenderness.  Neurological:     Mental Status: She is alert.  Psychiatric:        Mood and Affect: Mood normal.    Data Reviewed: Previous CT reviewed by myself again-this was from October 2022  Records from [facility] on 11/13/2021 reviewed-some post glottic edema noted on endoscopy  Pulmonary function test reviewed  Assessment:  Chronic  cough -Cough is completely resolved  Not having any ongoing symptoms at present   Past history of hoarseness -Symptoms resolved as well  Plan/Recommendations:  No significant ongoing concerns at present  I will see her as needed  Encouraged to call with any significant concerns   Jennet Epley MD [facility] 03/21/2023, 3:43 PM  CC: Stephane Leita DEL, MD

## 2023-03-21 NOTE — Patient Instructions (Signed)
 I will see you as needed  Call us with significant concerns

## 2023-03-22 DIAGNOSIS — M205X2 Other deformities of toe(s) (acquired), left foot: Secondary | ICD-10-CM | POA: Diagnosis not present

## 2023-03-22 DIAGNOSIS — M2042 Other hammer toe(s) (acquired), left foot: Secondary | ICD-10-CM | POA: Diagnosis not present

## 2023-03-22 DIAGNOSIS — M2012 Hallux valgus (acquired), left foot: Secondary | ICD-10-CM | POA: Diagnosis not present

## 2023-03-22 HISTORY — PX: OTHER SURGICAL HISTORY: SHX169

## 2023-03-23 ENCOUNTER — Telehealth: Payer: Self-pay | Admitting: Podiatry

## 2023-03-23 NOTE — Telephone Encounter (Signed)
 Patient called and stated that she is in extreme pain. She had a surgical operation yesterday and hasn't been able to go to sleep, and she is having is hard time walking. She stated that the boot she is in is to big and ineffective to her healing processes. She is asking if someone could reach out and go over some things she can do to help cope with pain.

## 2023-03-23 NOTE — Telephone Encounter (Signed)
 Thanks, she should be getting better. Continue ibuprohen and tylenol  besides her pain meds

## 2023-03-24 ENCOUNTER — Other Ambulatory Visit: Payer: Self-pay | Admitting: Podiatry

## 2023-03-28 ENCOUNTER — Ambulatory Visit (INDEPENDENT_AMBULATORY_CARE_PROVIDER_SITE_OTHER): Payer: Medicare Other

## 2023-03-28 ENCOUNTER — Telehealth: Payer: Self-pay | Admitting: Podiatry

## 2023-03-28 ENCOUNTER — Ambulatory Visit (INDEPENDENT_AMBULATORY_CARE_PROVIDER_SITE_OTHER): Payer: Medicare Other | Admitting: Podiatry

## 2023-03-28 ENCOUNTER — Encounter: Payer: Self-pay | Admitting: Podiatry

## 2023-03-28 DIAGNOSIS — Z9889 Other specified postprocedural states: Secondary | ICD-10-CM | POA: Diagnosis not present

## 2023-03-28 MED ORDER — OXYCODONE-ACETAMINOPHEN 10-325 MG PO TABS: 1.0000 | ORAL_TABLET | ORAL | 0 refills | Status: DC | PRN

## 2023-03-28 NOTE — Telephone Encounter (Signed)
Pt called on Friday 1/17 at 144pm in a panic because she is in a lot of pain after her surgery on Tuesday and she needed a reill on her pain medication to get her thru the weekend. She was not aware we were closed on Friday.  She said if she does not hear anything she will call doctor on call for the refill of her pain medication.  Pt was seen today 1/20 and medication was refilled

## 2023-03-29 ENCOUNTER — Telehealth: Payer: Self-pay | Admitting: Podiatry

## 2023-03-29 NOTE — Progress Notes (Signed)
Subjective:   Patient ID: Kathleen Daniels, female   DOB: 78 y.o.   MRN: [id number]   HPI Patient states she did have a lot of pain in her left foot after the surgery and the oxycodone helped but she has taken it all.  States that she has been keeping her foot elevated and is happy with how it looks with the dressing off   ROS      Objective:  Physical Exam  Neuro vascular status intact negative Denna Haggard' sign noted wound edges well coapted hallux in a rectus position pin in place second digit with stitches intact wound edges well coapted mild to moderate edema no other pathology noted     Assessment:  She may have just had an exaggerated pain response difficult to say but no indications pathology with excellent positioning of the first metatarsal second digit     Plan:  H&P x-rays reviewed cushion fluff dressing applied which seems to feel better for her I did write her for a few more pain medications oxycodone No. 20 and I advised on the importance of elevation and continued boot usage.  Reappoint next week for appointment and all questions answered  X-rays indicate everything appears to be stable there may be some stress on the first metatarsal osteotomy and we will keep continue to keep her completely immobilized as she did have relative soft bone.  Overall excellent correction from the preoperative condition

## 2023-03-29 NOTE — Telephone Encounter (Signed)
Pt called and it seems like pain is worse when she has her foot propped up. ITs like it is pushing on the area under arch area and toes go numb. Using a foam wedge to elevate. It feels better when up and walking around a good bit. A lot pf pain in the night when sleeping. Any advise on what she could do to help?

## 2023-03-30 ENCOUNTER — Telehealth: Payer: Self-pay | Admitting: Podiatry

## 2023-03-30 NOTE — Telephone Encounter (Signed)
Patient stated she is calling back today, due to not receiving a response. Patient would like for Heather to call her, due to pain she is experiencing.

## 2023-03-30 NOTE — Telephone Encounter (Signed)
Not much we can do at this time. Continue tylenol and pain medicine as needed. Should gradually get better

## 2023-03-31 NOTE — Telephone Encounter (Signed)
She can also have a surgical shoe if cannot tolerate boot

## 2023-04-06 ENCOUNTER — Ambulatory Visit (INDEPENDENT_AMBULATORY_CARE_PROVIDER_SITE_OTHER): Payer: Medicare Other | Admitting: Podiatry

## 2023-04-06 DIAGNOSIS — M2042 Other hammer toe(s) (acquired), left foot: Secondary | ICD-10-CM

## 2023-04-06 DIAGNOSIS — M2012 Hallux valgus (acquired), left foot: Secondary | ICD-10-CM

## 2023-04-06 DIAGNOSIS — M2041 Other hammer toe(s) (acquired), right foot: Secondary | ICD-10-CM | POA: Diagnosis not present

## 2023-04-06 DIAGNOSIS — M2011 Hallux valgus (acquired), right foot: Secondary | ICD-10-CM

## 2023-04-06 NOTE — Progress Notes (Signed)
Subjective:   Patient ID: Kathleen Daniels, female   DOB: 78 y.o.   MRN: [id number]   HPI Patient states feeling much better this week and doing well   ROS      Objective:  Physical Exam  Neuro vascular status intact negative Denna Haggard' sign was noted with the patient found to have good healing of the incision site stitches intact second toe good alignment pin intact second digit only mild edema consistent with this.  Postop     Assessment:  Improving quite a bit with good alignment noted and good incision site healing     Plan:  H&P and reviewed and went ahead today continue immobilization elevation compression and may slowly start getting the foot wet and will return in 3 weeks for pin removal with suture second digit removed today.  I went ahead today and I did dispense surgical shoe that she can wear just for short periods of time but I still want her wearing the boot for the vast majority of time

## 2023-04-11 ENCOUNTER — Encounter: Payer: Medicare Other | Admitting: Podiatry

## 2023-04-11 DIAGNOSIS — K080 Exfoliation of teeth due to systemic causes: Secondary | ICD-10-CM | POA: Diagnosis not present

## 2023-04-20 DIAGNOSIS — H1133 Conjunctival hemorrhage, bilateral: Secondary | ICD-10-CM | POA: Diagnosis not present

## 2023-04-21 DIAGNOSIS — Z1231 Encounter for screening mammogram for malignant neoplasm of breast: Secondary | ICD-10-CM | POA: Diagnosis not present

## 2023-04-27 ENCOUNTER — Encounter: Payer: Self-pay | Admitting: Podiatry

## 2023-04-27 ENCOUNTER — Ambulatory Visit (INDEPENDENT_AMBULATORY_CARE_PROVIDER_SITE_OTHER): Payer: Medicare Other | Admitting: Podiatry

## 2023-04-27 ENCOUNTER — Ambulatory Visit (INDEPENDENT_AMBULATORY_CARE_PROVIDER_SITE_OTHER): Payer: Medicare Other

## 2023-04-27 DIAGNOSIS — Z9889 Other specified postprocedural states: Secondary | ICD-10-CM

## 2023-04-28 ENCOUNTER — Telehealth: Payer: Self-pay | Admitting: Podiatry

## 2023-04-28 DIAGNOSIS — F331 Major depressive disorder, recurrent, moderate: Secondary | ICD-10-CM | POA: Diagnosis not present

## 2023-04-28 NOTE — Telephone Encounter (Signed)
Patient called stating she had surgery and had pin taken out yesterday with Dr Charlsie Merles. Pt said Dr Charlsie Merles gave her a compression stocking but no directions on how long to wear it. Please confirm with pt.

## 2023-04-28 NOTE — Progress Notes (Signed)
Subjective:   Patient ID: Kathleen Daniels, female   DOB: 78 y.o.   MRN: [id number]   HPI Patient presents for removal of pin second digit left states doing well   ROS      Objective:  Physical Exam  Neuro vascular status intact negative Denna Haggard' sign noted with patient's left foot healing well toe in good alignment pin intact with good range of motion first MPJ mild swelling consistent with.  Postop     Assessment:  Doing well post forefoot reconstruction left     Plan:  H&P pin removed second digit sterile dressing applied advised on range of motion exercises and anti-inflammatories and reappoint to review check continue immobilization elevation compression  X-rays indicate osteotomy is healing well alignment good fixation in place

## 2023-04-28 NOTE — Telephone Encounter (Signed)
Wear for 4-6 weeks as needed for swelling

## 2023-05-13 DIAGNOSIS — R051 Acute cough: Secondary | ICD-10-CM | POA: Diagnosis not present

## 2023-05-13 DIAGNOSIS — J069 Acute upper respiratory infection, unspecified: Secondary | ICD-10-CM | POA: Diagnosis not present

## 2023-05-13 DIAGNOSIS — R0981 Nasal congestion: Secondary | ICD-10-CM | POA: Diagnosis not present

## 2023-05-24 DIAGNOSIS — H353131 Nonexudative age-related macular degeneration, bilateral, early dry stage: Secondary | ICD-10-CM | POA: Diagnosis not present

## 2023-05-24 DIAGNOSIS — H2513 Age-related nuclear cataract, bilateral: Secondary | ICD-10-CM | POA: Diagnosis not present

## 2023-05-25 ENCOUNTER — Ambulatory Visit (INDEPENDENT_AMBULATORY_CARE_PROVIDER_SITE_OTHER): Admitting: Podiatry

## 2023-05-25 ENCOUNTER — Ambulatory Visit (INDEPENDENT_AMBULATORY_CARE_PROVIDER_SITE_OTHER)

## 2023-05-25 ENCOUNTER — Encounter: Payer: Medicare Other | Admitting: Podiatry

## 2023-05-25 DIAGNOSIS — Z9889 Other specified postprocedural states: Secondary | ICD-10-CM

## 2023-05-25 DIAGNOSIS — L600 Ingrowing nail: Secondary | ICD-10-CM | POA: Diagnosis not present

## 2023-05-26 NOTE — Progress Notes (Signed)
 Subjective:   Patient ID: Kathleen Daniels, female   DOB: 78 y.o.   MRN: [id number]   HPI Patient states doing very well with surgery with continued reduced edema   ROS      Objective:  Physical Exam  Neurovascular status intact negative Denna Haggard' sign noted wound edges are coapted well hallux in rectus position with good range of motion and second toe in good alignment slight internal rotation but much better than previous to surgery     Assessment:  Over all doing very well surgically and patient is active doubt that wearing normal shoe gear     Plan:  H&P final x-rays reviewed and I have recommended increased activity and gradual return to normal activity.  Patient's discharge will be seen back as needed  X-rays indicate osteotomies are healing well fixation in place second toe good alignment

## 2023-06-02 ENCOUNTER — Telehealth: Payer: Self-pay | Admitting: Physical Medicine and Rehabilitation

## 2023-06-02 DIAGNOSIS — F331 Major depressive disorder, recurrent, moderate: Secondary | ICD-10-CM | POA: Diagnosis not present

## 2023-06-02 NOTE — Telephone Encounter (Signed)
 Pt called requesting to move forward with injection per discussion with PA Mayford Knife. Please call pt when approved. Pt phone number is [phone].

## 2023-06-08 ENCOUNTER — Other Ambulatory Visit: Payer: Self-pay

## 2023-06-08 ENCOUNTER — Ambulatory Visit: Admitting: Physical Medicine and Rehabilitation

## 2023-06-08 ENCOUNTER — Encounter: Payer: Self-pay | Admitting: Physical Medicine and Rehabilitation

## 2023-06-08 VITALS — BP 139/75 | HR 65

## 2023-06-08 DIAGNOSIS — M4316 Spondylolisthesis, lumbar region: Secondary | ICD-10-CM | POA: Diagnosis not present

## 2023-06-08 DIAGNOSIS — R202 Paresthesia of skin: Secondary | ICD-10-CM

## 2023-06-08 DIAGNOSIS — M47816 Spondylosis without myelopathy or radiculopathy, lumbar region: Secondary | ICD-10-CM | POA: Diagnosis not present

## 2023-06-08 DIAGNOSIS — M48062 Spinal stenosis, lumbar region with neurogenic claudication: Secondary | ICD-10-CM | POA: Diagnosis not present

## 2023-06-08 DIAGNOSIS — M5416 Radiculopathy, lumbar region: Secondary | ICD-10-CM

## 2023-06-08 MED ORDER — METHYLPREDNISOLONE ACETATE 40 MG/ML IJ SUSP
40.0000 mg | Freq: Once | INTRAMUSCULAR | Status: AC
  Administered 2023-06-08: 40 mg

## 2023-06-08 NOTE — Progress Notes (Signed)
 [name] - 78 y.o. female MRN [id number]  Date of birth: [birth year]  Office Visit Note: Visit Date: 06/08/2023 PCP: Melida Quitter, MD Referred by: Melida Quitter, MD  Subjective: Chief Complaint  Patient presents with   Lower Back - Pain   HPI: Kathleen Daniels is a 78 y.o. female who comes in today for evaluation and management for chronic low back and right more than left hip and thigh pain.  The last time we saw her was in January and she was actually doing better at that point status post MRI.  MRI reviewed again with her today at length.  We spoke for about 20 to 30 minutes on her spine condition.  She is followed by Lendell Caprice and does excellent with physical therapy and strengthening.  She however has not had reexacerbation of chronic symptoms.  Pain is fairly severe at this point and really keeping her from doing what she would like to do.  Worse with standing and ambulating although she does stay active.  No focal weakness no bowel or bladder changes no red flag complaints.  Her symptoms are very consistent with most likely a combination of facet arthritis and stenosis.  We had originally thought of doing an L5 transforaminal injection on the right but now she is having a little bit more symptoms bilaterally and not wholly down the leg.  She has no hip or groin pain with rotation and no groin pain but does have hip pain.  She has tried and failed medication and therapy.     Review of Systems  Musculoskeletal:  Positive for back pain and joint pain.  Neurological:  Positive for tingling.  All other systems reviewed and are negative.  Otherwise per HPI.  Assessment & Plan: Visit Diagnoses:    ICD-10-CM   1. Lumbar radiculopathy  M54.16 XR C-ARM NO REPORT    Epidural Steroid injection    methylPREDNISolone acetate (DEPO-MEDROL) injection 40 mg    2. Spinal stenosis of lumbar region with neurogenic claudication  M48.062 XR C-ARM NO REPORT    Epidural Steroid injection     methylPREDNISolone acetate (DEPO-MEDROL) injection 40 mg    3. Spondylolisthesis of lumbar region  M43.16     4. Spondylosis without myelopathy or radiculopathy, lumbar region  M47.816     5. Paresthesia of skin  R20.2        Plan: Findings:  Chronic and worsening and severe low back pain and right more than left hip and leg pain consistent with a combination of facet arthritis and lumbar stenosis.  We had a long discussion about her spine condition today.  She will continue with pretty aggressive physical therapy with Lendell Caprice.  She we will continue current medications.  We do think an epidural injections probably warranted at this point given the return of symptoms.  We did complete an interlaminar injection today.  She has a pretty significant listhesis as well.  Unfortunately from a surgery standpoint likely might be more of a fusion type situation but would consider at least referral for information purposes at some point.    Meds & Orders:  Meds ordered this encounter  Medications   methylPREDNISolone acetate (DEPO-MEDROL) injection 40 mg    Orders Placed This Encounter  Procedures   XR C-ARM NO REPORT   Epidural Steroid injection    Follow-up: Return if symptoms worsen or fail to improve.   Procedures: No procedures performed  Lumbar Epidural Steroid Injection - Interlaminar  Approach with Fluoroscopic Guidance  Patient: Kathleen Daniels      Date of Birth: [birth year] MRN: [id number] PCP: Melida Quitter, MD      Visit Date: 06/08/2023   Universal Protocol:     Consent Given By: the patient  Position: PRONE  Additional Comments: Vital signs were monitored before and after the procedure. Patient was prepped and draped in the usual sterile fashion. The correct patient, procedure, and site was verified.   Injection Procedure Details:   Procedure diagnoses: Lumbar radiculopathy [M54.16]   Meds Administered:  Meds ordered this encounter  Medications    methylPREDNISolone acetate (DEPO-MEDROL) injection 40 mg     Laterality: Right  Location/Site:  L5-S1  Needle: 3.5 in., 20 ga. Tuohy  Needle Placement: Paramedian epidural  Findings:   -Comments: Excellent flow of contrast into the epidural space.  Procedure Details: Using a paramedian approach from the side mentioned above, the region overlying the inferior lamina was localized under fluoroscopic visualization and the soft tissues overlying this structure were infiltrated with 4 ml. of 1% Lidocaine without Epinephrine. The Tuohy needle was inserted into the epidural space using a paramedian approach.   The epidural space was localized using loss of resistance along with counter oblique bi-planar fluoroscopic views.  After negative aspirate for air, blood, and CSF, a 2 ml. volume of Isovue-250 was injected into the epidural space and the flow of contrast was observed. Radiographs were obtained for documentation purposes.    The injectate was administered into the level noted above.   Additional Comments:  The patient tolerated the procedure well Dressing: 2 x 2 sterile gauze and Band-Aid    Post-procedure details: Patient was observed during the procedure. Post-procedure instructions were reviewed.  Patient left the clinic in stable condition.   Clinical History: Narrative & Impression CLINICAL DATA:  Low back pain, symptoms persist with > 6 wks treatment   EXAM: MRI LUMBAR SPINE WITHOUT CONTRAST   TECHNIQUE: Multiplanar, multisequence MR imaging of the lumbar spine was performed. No intravenous contrast was administered.   COMPARISON:  None Available.   FINDINGS: Segmentation:  Standard.   Alignment: Trace retrolisthesis of L1 on L2, L2 on L3, L3 on L4. Grade 1 anterolisthesis of L4 on L5 L5 on S1.   Vertebrae: There is a Schmorl's node of the inferior endplate of L5. No acute fracture. No evidence of discitis or osteomyelitis.   Conus medullaris and cauda  equina: Conus extends to the L1-L2 disc space level. Conus and cauda equina appear normal.   Paraspinal and other soft tissues: Mental   Disc levels:   T12-L1: Circumferential disc bulge. Mild bilateral facet degenerative change. No spinal canal narrowing. No neural foraminal narrowing.   L1-L2: Moderate bilateral facet degenerative change. Circumferential disc bulge. Mild overall spinal canal narrowing. Mild moderate left and moderate right neural foraminal narrowing.   L2-L3: Circumferential disc bulge. Mild bilateral facet degenerative change. Mild spinal canal narrowing. Moderate bilateral neural foraminal narrowing.   L3-L4: Moderate bilateral facet degenerative change. Circumferential disc bulge. Moderate spinal canal narrowing. Moderate to severe left and moderate right neural foraminal narrowing.   L4-L5: Severe bilateral facet degenerative change. Circumferential disc bulge. Moderate to severe spinal canal narrowing. Severe bilateral neural foraminal narrowing.   L5-S1: Moderate bilateral facet degenerative change. Circumferential disc bulge. Mild overall spinal canal narrowing. Severe bilateral neural foraminal narrowing.   IMPRESSION: 1. Moderate to severe spinal canal narrowing at L4-L5 secondary to a combination a circumferential disc bulge and ligamentum flavum  hypertrophy. 2. Severe bilateral neural foraminal narrowing at L4-L5 and L5-S1. 3. Additional degenerative changes, as above.     Electronically Signed   By: Lorenza Cambridge M.D.   On: 03/14/2023 08:37   She reports that she has never smoked. She has never been exposed to tobacco smoke. She has never used smokeless tobacco. No results for input(s): "HGBA1C", "LABURIC" in the last 8760 hours.  Objective:  VS:  HT:    WT:   BMI:     BP:139/75  HR:65bpm  TEMP: ( )  RESP:  Physical Exam Vitals and nursing note reviewed.  Constitutional:      General: She is not in acute distress.    Appearance:  Normal appearance. She is well-developed. She is not ill-appearing.  HENT:     Head: Normocephalic and atraumatic.     Right Ear: External ear normal.     Left Ear: External ear normal.  Eyes:     Extraocular Movements: Extraocular movements intact.     Conjunctiva/sclera: Conjunctivae normal.     Pupils: Pupils are equal, round, and reactive to light.  Cardiovascular:     Rate and Rhythm: Normal rate.     Pulses: Normal pulses.  Pulmonary:     Effort: Pulmonary effort is normal. No respiratory distress.  Abdominal:     General: There is no distension.     Palpations: Abdomen is soft.  Musculoskeletal:        General: Tenderness present.     Cervical back: Neck supple.     Right lower leg: No edema.     Left lower leg: No edema.     Comments: Patient has good distal strength with no pain over the greater trochanters.  No clonus or focal weakness.  Skin:    General: Skin is warm and dry.     Findings: No erythema, lesion or rash.  Neurological:     General: No focal deficit present.     Mental Status: She is alert and oriented to person, place, and time.     Sensory: No sensory deficit.     Motor: No weakness or abnormal muscle tone.     Coordination: Coordination normal.     Gait: Gait normal.  Psychiatric:        Mood and Affect: Mood normal.        Behavior: Behavior normal.     Ortho Exam  Imaging: No results found.  Past Medical/Family/Surgical/Social History: Medications & Allergies reviewed per EMR, new medications updated. Patient Active Problem List   Diagnosis Date Noted   Bilateral sensorineural hearing loss 12/08/2021   Cough 11/13/2021   Presbycusis of both ears 11/13/2021   Fracture of inferior pubic ramus (HCC) 06/17/2021   Pain of left hip joint 06/16/2021   Acute posttraumatic stress disorder    Urinary urgency    Drug induced constipation    Nausea and vomiting    Acute blood loss anemia    Hypoalbuminemia due to protein-calorie malnutrition  (HCC)    Acute traumatic pain    Hyponatremia    Acetabular fracture (HCC) 12/25/2020   Right distal ulnar fracture 12/25/2020   Pelvic fracture (HCC) 12/17/2020   Closed left acetabular fracture (HCC) 12/17/2020   SVT (supraventricular tachycardia) (HCC) 11/16/2019   ADD (attention deficit disorder) 05/17/2014   Past Medical History:  Diagnosis Date   ADD (attention deficit disorder)    ADHD    Alopecia    Anemia    when had menstrual cycle  Arthritis    Complication of anesthesia    "mind fog" with general anesthesia   Complication of anesthesia    was "awake" during her ablation.   Complication of anesthesia    leads to depression   Depression    s/p ECT   Dysrhythmia    SVT with ablation   GERD (gastroesophageal reflux disease)    Hernia of abdominal cavity    SVT (supraventricular tachycardia) (HCC)    Had an ablation   Family History  Problem Relation Age of Onset   Bladder Cancer Mother    Diabetes Father    Alzheimer's disease Father    Colon cancer Neg Hx    Pancreatic cancer Neg Hx    Stomach cancer Neg Hx    Past Surgical History:  Procedure Laterality Date   ABDOMINAL HYSTERECTOMY     AUSTIN BUNIONECTOMY   03/22/2023   [place] BUNIONECTOMY WITH FIXATION LEFT, FUSION WITH POSSIBLE AIKEN OSTEOTOMY WITH WIRE LEFT PIN 2ND TOE LEFT   BREAST ENHANCEMENT SURGERY     BREAST IMPLANT REMOVAL     MOUTH SURGERY  2024   tooth removal with post for implant placed   SVT ABLATION N/A 12/17/2019   Procedure: SVT ABLATION;  Surgeon: Marinus Maw, MD;  Location: [facility];  Service: Cardiovascular;  Laterality: N/A;   XI ROBOTIC ASSISTED VENTRAL HERNIA N/A 10/01/2022   Procedure: XI ROBOTIC ASSISTED VENTRAL HERNIA REPAIR WITH MESH;  Surgeon: Kinsinger, De Blanch, MD;  Location: [facility];  Service: General;  Laterality: N/A;   Social History   Occupational History   Occupation: retired  Tobacco Use   Smoking status: Never    Passive exposure: Never    Smokeless tobacco: Never  Vaping Use   Vaping status: Never Used  Substance and Sexual Activity   Alcohol use: Yes    Alcohol/week: 1.0 standard drink of alcohol    Types: 1 Glasses of wine per week    Comment: occ   Drug use: No   Sexual activity: Not Currently

## 2023-06-08 NOTE — Patient Instructions (Signed)

## 2023-06-08 NOTE — Procedures (Signed)
 Lumbar Epidural Steroid Injection - Interlaminar Approach with Fluoroscopic Guidance  Patient: Kathleen Daniels      Date of Birth: [birth year] MRN: [id number] PCP: Melida Quitter, MD      Visit Date: 06/08/2023   Universal Protocol:     Consent Given By: the patient  Position: PRONE  Additional Comments: Vital signs were monitored before and after the procedure. Patient was prepped and draped in the usual sterile fashion. The correct patient, procedure, and site was verified.   Injection Procedure Details:   Procedure diagnoses: Lumbar radiculopathy [M54.16]   Meds Administered:  Meds ordered this encounter  Medications   methylPREDNISolone acetate (DEPO-MEDROL) injection 40 mg     Laterality: Right  Location/Site:  L5-S1  Needle: 3.5 in., 20 ga. Tuohy  Needle Placement: Paramedian epidural  Findings:   -Comments: Excellent flow of contrast into the epidural space.  Procedure Details: Using a paramedian approach from the side mentioned above, the region overlying the inferior lamina was localized under fluoroscopic visualization and the soft tissues overlying this structure were infiltrated with 4 ml. of 1% Lidocaine without Epinephrine. The Tuohy needle was inserted into the epidural space using a paramedian approach.   The epidural space was localized using loss of resistance along with counter oblique bi-planar fluoroscopic views.  After negative aspirate for air, blood, and CSF, a 2 ml. volume of Isovue-250 was injected into the epidural space and the flow of contrast was observed. Radiographs were obtained for documentation purposes.    The injectate was administered into the level noted above.   Additional Comments:  The patient tolerated the procedure well Dressing: 2 x 2 sterile gauze and Band-Aid    Post-procedure details: Patient was observed during the procedure. Post-procedure instructions were reviewed.  Patient left the clinic in stable condition.

## 2023-06-08 NOTE — Progress Notes (Signed)
 Pain Scale   Average Pain 6        +Driver, -BT, -Dye Allergies.

## 2023-06-13 DIAGNOSIS — H0012 Chalazion right lower eyelid: Secondary | ICD-10-CM | POA: Diagnosis not present

## 2023-06-20 DIAGNOSIS — L6611 Classic lichen planopilaris: Secondary | ICD-10-CM | POA: Diagnosis not present

## 2023-06-20 DIAGNOSIS — L821 Other seborrheic keratosis: Secondary | ICD-10-CM | POA: Diagnosis not present

## 2023-06-20 DIAGNOSIS — D1801 Hemangioma of skin and subcutaneous tissue: Secondary | ICD-10-CM | POA: Diagnosis not present

## 2023-06-30 DIAGNOSIS — H2511 Age-related nuclear cataract, right eye: Secondary | ICD-10-CM | POA: Diagnosis not present

## 2023-06-30 DIAGNOSIS — H25811 Combined forms of age-related cataract, right eye: Secondary | ICD-10-CM | POA: Diagnosis not present

## 2023-07-05 DIAGNOSIS — F331 Major depressive disorder, recurrent, moderate: Secondary | ICD-10-CM | POA: Diagnosis not present

## 2023-07-07 DIAGNOSIS — H2512 Age-related nuclear cataract, left eye: Secondary | ICD-10-CM | POA: Diagnosis not present

## 2023-07-07 DIAGNOSIS — H25812 Combined forms of age-related cataract, left eye: Secondary | ICD-10-CM | POA: Diagnosis not present

## 2023-07-14 DIAGNOSIS — Z79899 Other long term (current) drug therapy: Secondary | ICD-10-CM | POA: Diagnosis not present

## 2023-07-14 DIAGNOSIS — D72819 Decreased white blood cell count, unspecified: Secondary | ICD-10-CM | POA: Diagnosis not present

## 2023-07-14 DIAGNOSIS — M85859 Other specified disorders of bone density and structure, unspecified thigh: Secondary | ICD-10-CM | POA: Diagnosis not present

## 2023-07-14 DIAGNOSIS — R7301 Impaired fasting glucose: Secondary | ICD-10-CM | POA: Diagnosis not present

## 2023-07-21 ENCOUNTER — Telehealth: Payer: Self-pay | Admitting: Physical Medicine and Rehabilitation

## 2023-07-21 DIAGNOSIS — Z0000 Encounter for general adult medical examination without abnormal findings: Secondary | ICD-10-CM | POA: Diagnosis not present

## 2023-07-21 DIAGNOSIS — Z1331 Encounter for screening for depression: Secondary | ICD-10-CM | POA: Diagnosis not present

## 2023-07-21 DIAGNOSIS — Z1339 Encounter for screening examination for other mental health and behavioral disorders: Secondary | ICD-10-CM | POA: Diagnosis not present

## 2023-07-21 DIAGNOSIS — M85859 Other specified disorders of bone density and structure, unspecified thigh: Secondary | ICD-10-CM | POA: Diagnosis not present

## 2023-07-21 NOTE — Telephone Encounter (Signed)
 Pt called requesting an appt for shoulder injection with [facility]. Please call pt ASAP. Pt phone number is [phone].

## 2023-07-22 DIAGNOSIS — M19012 Primary osteoarthritis, left shoulder: Secondary | ICD-10-CM | POA: Diagnosis not present

## 2023-07-28 DIAGNOSIS — F331 Major depressive disorder, recurrent, moderate: Secondary | ICD-10-CM | POA: Diagnosis not present

## 2023-08-04 ENCOUNTER — Other Ambulatory Visit (HOSPITAL_BASED_OUTPATIENT_CLINIC_OR_DEPARTMENT_OTHER): Payer: Self-pay

## 2023-08-04 MED ORDER — COMIRNATY 30 MCG/0.3ML IM SUSY
PREFILLED_SYRINGE | INTRAMUSCULAR | 0 refills | Status: DC
  Filled 2023-08-04: qty 0.3, 1d supply, fill #0

## 2023-08-25 DIAGNOSIS — F331 Major depressive disorder, recurrent, moderate: Secondary | ICD-10-CM | POA: Diagnosis not present

## 2023-09-15 DIAGNOSIS — F331 Major depressive disorder, recurrent, moderate: Secondary | ICD-10-CM | POA: Diagnosis not present

## 2023-09-29 NOTE — Telephone Encounter (Signed)
 Error

## 2023-09-30 DIAGNOSIS — F3342 Major depressive disorder, recurrent, in full remission: Secondary | ICD-10-CM | POA: Diagnosis not present

## 2023-10-06 DIAGNOSIS — K080 Exfoliation of teeth due to systemic causes: Secondary | ICD-10-CM | POA: Diagnosis not present

## 2023-10-10 DIAGNOSIS — K080 Exfoliation of teeth due to systemic causes: Secondary | ICD-10-CM | POA: Diagnosis not present

## 2023-10-27 DIAGNOSIS — F331 Major depressive disorder, recurrent, moderate: Secondary | ICD-10-CM | POA: Diagnosis not present

## 2023-12-08 DIAGNOSIS — F331 Major depressive disorder, recurrent, moderate: Secondary | ICD-10-CM | POA: Diagnosis not present

## 2023-12-10 DIAGNOSIS — Z23 Encounter for immunization: Secondary | ICD-10-CM | POA: Diagnosis not present

## 2023-12-21 DIAGNOSIS — M5459 Other low back pain: Secondary | ICD-10-CM | POA: Diagnosis not present

## 2023-12-21 DIAGNOSIS — M545 Low back pain, unspecified: Secondary | ICD-10-CM | POA: Diagnosis not present

## 2023-12-21 DIAGNOSIS — S46012A Strain of muscle(s) and tendon(s) of the rotator cuff of left shoulder, initial encounter: Secondary | ICD-10-CM | POA: Diagnosis not present

## 2023-12-21 DIAGNOSIS — M25512 Pain in left shoulder: Secondary | ICD-10-CM | POA: Diagnosis not present

## 2023-12-22 DIAGNOSIS — M25512 Pain in left shoulder: Secondary | ICD-10-CM | POA: Diagnosis not present

## 2023-12-22 DIAGNOSIS — M545 Low back pain, unspecified: Secondary | ICD-10-CM | POA: Diagnosis not present

## 2023-12-22 DIAGNOSIS — S46012A Strain of muscle(s) and tendon(s) of the rotator cuff of left shoulder, initial encounter: Secondary | ICD-10-CM | POA: Diagnosis not present

## 2023-12-22 DIAGNOSIS — M5459 Other low back pain: Secondary | ICD-10-CM | POA: Diagnosis not present

## 2023-12-26 DIAGNOSIS — S46012A Strain of muscle(s) and tendon(s) of the rotator cuff of left shoulder, initial encounter: Secondary | ICD-10-CM | POA: Diagnosis not present

## 2023-12-26 DIAGNOSIS — M25512 Pain in left shoulder: Secondary | ICD-10-CM | POA: Diagnosis not present

## 2023-12-26 DIAGNOSIS — M5459 Other low back pain: Secondary | ICD-10-CM | POA: Diagnosis not present

## 2023-12-26 DIAGNOSIS — M545 Low back pain, unspecified: Secondary | ICD-10-CM | POA: Diagnosis not present

## 2023-12-27 DIAGNOSIS — K080 Exfoliation of teeth due to systemic causes: Secondary | ICD-10-CM | POA: Diagnosis not present

## 2023-12-28 DIAGNOSIS — S46012A Strain of muscle(s) and tendon(s) of the rotator cuff of left shoulder, initial encounter: Secondary | ICD-10-CM | POA: Diagnosis not present

## 2023-12-28 DIAGNOSIS — M5459 Other low back pain: Secondary | ICD-10-CM | POA: Diagnosis not present

## 2023-12-28 DIAGNOSIS — M545 Low back pain, unspecified: Secondary | ICD-10-CM | POA: Diagnosis not present

## 2023-12-28 DIAGNOSIS — M25512 Pain in left shoulder: Secondary | ICD-10-CM | POA: Diagnosis not present

## 2024-01-03 DIAGNOSIS — M5459 Other low back pain: Secondary | ICD-10-CM | POA: Diagnosis not present

## 2024-01-03 DIAGNOSIS — S46012A Strain of muscle(s) and tendon(s) of the rotator cuff of left shoulder, initial encounter: Secondary | ICD-10-CM | POA: Diagnosis not present

## 2024-01-03 DIAGNOSIS — M545 Low back pain, unspecified: Secondary | ICD-10-CM | POA: Diagnosis not present

## 2024-01-03 DIAGNOSIS — M25512 Pain in left shoulder: Secondary | ICD-10-CM | POA: Diagnosis not present

## 2024-01-05 DIAGNOSIS — F331 Major depressive disorder, recurrent, moderate: Secondary | ICD-10-CM | POA: Diagnosis not present

## 2024-01-06 DIAGNOSIS — B3731 Acute candidiasis of vulva and vagina: Secondary | ICD-10-CM | POA: Diagnosis not present

## 2024-01-06 DIAGNOSIS — L270 Generalized skin eruption due to drugs and medicaments taken internally: Secondary | ICD-10-CM | POA: Diagnosis not present

## 2024-01-09 ENCOUNTER — Encounter: Payer: Self-pay | Admitting: Radiology

## 2024-01-09 ENCOUNTER — Encounter: Payer: Self-pay | Admitting: Internal Medicine

## 2024-01-09 ENCOUNTER — Other Ambulatory Visit: Payer: Self-pay

## 2024-01-09 ENCOUNTER — Ambulatory Visit: Admitting: Internal Medicine

## 2024-01-09 VITALS — BP 130/88 | HR 75 | Temp 98.0°F | Resp 18 | Ht 61.0 in

## 2024-01-09 DIAGNOSIS — L501 Idiopathic urticaria: Secondary | ICD-10-CM | POA: Diagnosis not present

## 2024-01-09 DIAGNOSIS — Z880 Allergy status to penicillin: Secondary | ICD-10-CM | POA: Diagnosis not present

## 2024-01-09 DIAGNOSIS — L308 Other specified dermatitis: Secondary | ICD-10-CM

## 2024-01-09 MED ORDER — METHYLPREDNISOLONE ACETATE 40 MG/ML IJ SUSP
80.0000 mg | Freq: Once | INTRAMUSCULAR | Status: AC
Start: 2024-01-09 — End: 2024-01-09
  Administered 2024-01-09: 80 mg via INTRAMUSCULAR

## 2024-01-09 MED ORDER — PREDNISONE 10 MG PO TABS: ORAL_TABLET | ORAL | 0 refills | Status: AC

## 2024-01-09 NOTE — Patient Instructions (Addendum)
 Chronic Idiopathic Urticaria:  with acute flare, typical hive presentation  - differential diagnosis includes: urticaria, delayed type drug reaction and erythema multiforme from HSV  - 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 - Start Cetirizine 20mg  twice daily  - Start pepcid  20mg  twice daily  - Avoid amoxicillin and all NSAIDs  - Can use hydroxyzine 10mg  at night for breakthrough symptoms     Follow up: 6 month   Thank you so much for letting me partake in your care today.  Don't hesitate to reach out if you have any additional concerns!  Hargis Springer, MD  [facility]- [place], [place]

## 2024-01-09 NOTE — Progress Notes (Signed)
 FOLLOW UP Date of Service/Encounter:  01/09/24  Subjective:  Kathleen Daniels (DOB: 1946/02/22) is a 78 y.o. female who [facility] in re-evaluation of the following: idiopathic urticaria  History obtained from: chart review and patient.  For Review, LV was on 01/21/23  with Dr. Lorin seen for routine follow-up. See below for summary of history and diagnostics.   Therapeutic plans/changes recommended: tried to start xolair ,  started on Allegra 180 mg twice daily, Pepcid  20 mg daily. ----------------------------------------------------- Pertinent History/Diagnostics:   Urticaria:  Pruritus onset after car accidental and prolonged hospilization, franke urticaria flared after hospitalization for inguinal hernia repair.  - labs (10/18/22)  sIgE positive to grass, weed and cockroach, TSH slightly low,   Drug Allergy: Penicillin  - angioedema, urticaria, wheezing  - Not interested in testing   ---------------------------------------------------  History of Present Illness   Discussed the use of AI scribe software for clinical note transcription with the patient, who gave verbal consent to proceed.  History of Present Illness Kathleen Daniels is a 79 year old female who presents with a worsening rash after taking amoxicillin.  Cutaneous hypersensitivity reaction - Worsening pruritic rash after amoxicillin exposure for a sore gum persisting six months - Known penicillin allergy, took amoxicillin as prescribed by dentist - Initial mild pruritic rash on abdomen on day 7 of amoxicillin, progressed to widespread eruption by next day - Rash now involves abdomen, buttocks, pubic area, palms, and back - Rash is intensely pruritic but not tender - Facial swelling and throat discomfort ('funny' sensation) occurred with rash progression - No oral mucosal lesions, but burning sensation in mouth - lesions are fixed and spreading  - No antihistamines  taken today, awaiting further evaluation  Therapeutic interventions and response - Took Benadryl  with minimal improvement in rash, but aided sleep - Received Solu-Medrol  injection and prescribed prednisone  and triamcinolone by primary care physician  -received injection on 10/21  - Currently taking prednisone  and using Zyrtec 10mg  dialy, pepcid  10mg  daily, calamine lotion, hydrocortisone  - Rash has not resolved with current therapy, unlike previous episodes of hives  History of urticaria - Previous episode of hives one year ago, characterized by transient lesions that appeared and disappeared - Current rash is persistent and more severe compared to prior urticaria  Herpes simplex virus reactivation risk - History of herpes simplex virus infection - Took valacyclovir at onset of prodromal symptoms (potential fever blisters) during current illness - No development of herpetic blisters after valacyclovir     All medications reviewed by clinical staff and updated in chart. No new pertinent medical or surgical history except as noted in HPI.  ROS: All others negative except as noted per HPI.   Objective:  BP 130/88 (BP Location: Right Arm, Patient Position: Sitting, Cuff Size: Normal)   Pulse 75   Temp 98 F (36.7 C) (Temporal)   Resp 18   Ht 5' 1 (1.549 m)   SpO2 97%   BMI 25.43 kg/m  Body mass index is 25.43 kg/m. Physical Exam: General Appearance:  Alert, cooperative, no distress, appears stated age  Head:  Normocephalic, without obvious abnormality, atraumatic  Eyes:  Conjunctiva clear, EOM's intact  Ears EACs normal bilaterally  Nose: Nares normal,    Throat: Lips, tongue normal; teeth and gums normal,    Neck: Supple, symmetrical  Lungs:    , Respirations unlabored, no coughing  Heart:   , Appears well perfused  Extremities: No edema  Skin: Diffuse erythematous patches, some with central clearing   Neurologic: No gross deficits   Labs:  Lab Orders          CMP14+EGFR         CBC With Diff/Platelet         Sed Rate (ESR)         C-reactive protein        Assessment/Plan   Patient Instructions  Chronic Idiopathic Urticaria:  with acute flare, typical hive presentation  - differential diagnosis includes: urticaria, delayed type drug reaction and erythema multiforme from HSV  - 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 - Start Cetirizine 20mg  twice daily  - Start pepcid  20mg  twice daily  - Avoid amoxicillin and all NSAIDs  - Can use hydroxyzine 10mg  at night for breakthrough symptoms     Follow up: 6 month   Thank you so much for letting me partake in your care today.  Don't hesitate to reach out if you have any additional concerns!  Hargis Springer, MD  [facility]- [place], [place]      Follow up: 6 month   Other: none   Thank you so much for letting me partake in your care today.  Don't hesitate to reach out if you have any additional concerns!  Hargis Springer, MD  [facility]- , [place]

## 2024-01-10 ENCOUNTER — Ambulatory Visit: Payer: Self-pay | Admitting: Internal Medicine

## 2024-01-10 LAB — CBC WITH DIFF/PLATELET
Basophils Absolute: 0 x10E3/uL (ref 0.0–0.2)
Basos: 0 %
EOS (ABSOLUTE): 0.3 x10E3/uL (ref 0.0–0.4)
Eos: 4 %
Hematocrit: 37.2 % (ref 34.0–46.6)
Hemoglobin: 12.4 g/dL (ref 11.1–15.9)
Immature Grans (Abs): 0 x10E3/uL (ref 0.0–0.1)
Immature Granulocytes: 0 %
Lymphocytes Absolute: 0.5 x10E3/uL — ABNORMAL LOW (ref 0.7–3.1)
Lymphs: 8 %
MCH: 29.8 pg (ref 26.6–33.0)
MCHC: 33.3 g/dL (ref 31.5–35.7)
MCV: 89 fL (ref 79–97)
Monocytes Absolute: 0.2 x10E3/uL (ref 0.1–0.9)
Monocytes: 2 %
Neutrophils Absolute: 5.9 x10E3/uL (ref 1.4–7.0)
Neutrophils: 86 %
Platelets: 292 x10E3/uL (ref 150–450)
RBC: 4.16 x10E6/uL (ref 3.77–5.28)
RDW: 12.5 % (ref 11.7–15.4)
WBC: 7 x10E3/uL (ref 3.4–10.8)

## 2024-01-10 LAB — CMP14+EGFR
ALT: 21 IU/L (ref 0–32)
AST: 17 IU/L (ref 0–40)
Albumin: 4.3 g/dL (ref 3.8–4.8)
Alkaline Phosphatase: 87 IU/L (ref 49–135)
BUN/Creatinine Ratio: 30 — ABNORMAL HIGH (ref 12–28)
BUN: 23 mg/dL (ref 8–27)
Bilirubin Total: 0.4 mg/dL (ref 0.0–1.2)
CO2: 22 mmol/L (ref 20–29)
Calcium: 9.2 mg/dL (ref 8.7–10.3)
Chloride: 100 mmol/L (ref 96–106)
Creatinine, Ser: 0.77 mg/dL (ref 0.57–1.00)
Globulin, Total: 2.2 g/dL (ref 1.5–4.5)
Glucose: 114 mg/dL — ABNORMAL HIGH (ref 70–99)
Potassium: 4.3 mmol/L (ref 3.5–5.2)
Sodium: 135 mmol/L (ref 134–144)
Total Protein: 6.5 g/dL (ref 6.0–8.5)
eGFR: 80 mL/min/1.73 (ref >59)

## 2024-01-10 LAB — C-REACTIVE PROTEIN: CRP: 3 mg/L (ref 0–10)

## 2024-01-10 LAB — SEDIMENTATION RATE: Sed Rate: 5 mm/h (ref 0–40)

## 2024-01-10 NOTE — Progress Notes (Signed)
 Left message on machine labs normal per Dr Lorin give us  a call if questions or concerns

## 2024-01-10 NOTE — Progress Notes (Signed)
 Lab are reassuring/normal.  Can someone let patient know?

## 2024-01-14 DIAGNOSIS — F331 Major depressive disorder, recurrent, moderate: Secondary | ICD-10-CM | POA: Diagnosis not present

## 2024-01-20 DIAGNOSIS — L821 Other seborrheic keratosis: Secondary | ICD-10-CM | POA: Diagnosis not present

## 2024-01-20 DIAGNOSIS — B009 Herpesviral infection, unspecified: Secondary | ICD-10-CM | POA: Diagnosis not present

## 2024-01-20 DIAGNOSIS — L509 Urticaria, unspecified: Secondary | ICD-10-CM | POA: Diagnosis not present

## 2024-01-20 DIAGNOSIS — L718 Other rosacea: Secondary | ICD-10-CM | POA: Diagnosis not present

## 2024-02-07 ENCOUNTER — Other Ambulatory Visit: Payer: Self-pay | Admitting: Orthopaedic Surgery

## 2024-02-07 DIAGNOSIS — G8929 Other chronic pain: Secondary | ICD-10-CM

## 2024-02-07 DIAGNOSIS — M19112 Post-traumatic osteoarthritis, left shoulder: Secondary | ICD-10-CM | POA: Diagnosis not present

## 2024-02-14 DIAGNOSIS — L282 Other prurigo: Secondary | ICD-10-CM | POA: Diagnosis not present

## 2024-02-14 DIAGNOSIS — L509 Urticaria, unspecified: Secondary | ICD-10-CM | POA: Diagnosis not present

## 2024-02-14 DIAGNOSIS — D485 Neoplasm of uncertain behavior of skin: Secondary | ICD-10-CM | POA: Diagnosis not present

## 2024-04-26 ENCOUNTER — Other Ambulatory Visit

## 2024-04-27 ENCOUNTER — Other Ambulatory Visit
